# Patient Record
Sex: Female | Born: 1950
Health system: Southern US, Community
[De-identification: ages and names within clinical notes are randomized; demographics above are authoritative.]

## PROBLEM LIST (undated history)

## (undated) DIAGNOSIS — M1990 Unspecified osteoarthritis, unspecified site: Secondary | ICD-10-CM

## (undated) DIAGNOSIS — M2550 Pain in unspecified joint: Secondary | ICD-10-CM

## (undated) DIAGNOSIS — R42 Dizziness and giddiness: Secondary | ICD-10-CM

## (undated) DIAGNOSIS — M25569 Pain in unspecified knee: Secondary | ICD-10-CM

## (undated) DIAGNOSIS — R200 Anesthesia of skin: Secondary | ICD-10-CM

## (undated) DIAGNOSIS — A6920 Lyme disease, unspecified: Secondary | ICD-10-CM

## (undated) DIAGNOSIS — F32A Depression, unspecified: Secondary | ICD-10-CM

## (undated) DIAGNOSIS — F419 Anxiety disorder, unspecified: Secondary | ICD-10-CM

## (undated) DIAGNOSIS — M549 Dorsalgia, unspecified: Secondary | ICD-10-CM

## (undated) DIAGNOSIS — I10 Essential (primary) hypertension: Secondary | ICD-10-CM

## (undated) DIAGNOSIS — F329 Major depressive disorder, single episode, unspecified: Secondary | ICD-10-CM

## (undated) HISTORY — DX: Essential (primary) hypertension: I10

## (undated) HISTORY — DX: Unspecified osteoarthritis, unspecified site: M19.90

## (undated) HISTORY — DX: Major depressive disorder, single episode, unspecified: F32.9

## (undated) HISTORY — PX: BACK SURGERY: SHX140

## (undated) HISTORY — DX: Pain in unspecified knee: M25.569

## (undated) HISTORY — PX: TONSILLECTOMY AND ADENOIDECTOMY: SUR1326

## (undated) HISTORY — PX: WISDOM TOOTH EXTRACTION: SHX21

## (undated) HISTORY — DX: Depression, unspecified: F32.A

## (undated) HISTORY — DX: Anesthesia of skin: R20.0

## (undated) HISTORY — DX: Anxiety disorder, unspecified: F41.9

## (undated) HISTORY — PX: LASIK: SHX215

## (undated) HISTORY — DX: Pain in unspecified joint: M25.50

## (undated) HISTORY — DX: Dorsalgia, unspecified: M54.9

## (undated) HISTORY — DX: Lyme disease, unspecified: A69.20

## (undated) HISTORY — DX: Dizziness and giddiness: R42

---

## 2002-02-12 HISTORY — PX: DILATION AND CURETTAGE OF UTERUS: SHX78

## 2003-09-18 ENCOUNTER — Emergency Department (HOSPITAL_COMMUNITY): Admission: EM | Admit: 2003-09-18 | Discharge: 2003-09-18 | Payer: Self-pay | Admitting: Family Medicine

## 2003-09-18 ENCOUNTER — Inpatient Hospital Stay (HOSPITAL_COMMUNITY): Admission: EM | Admit: 2003-09-18 | Discharge: 2003-09-19 | Payer: Self-pay | Admitting: Emergency Medicine

## 2003-10-12 ENCOUNTER — Encounter: Admission: RE | Admit: 2003-10-12 | Discharge: 2003-10-12 | Payer: Self-pay | Admitting: Obstetrics and Gynecology

## 2003-10-25 ENCOUNTER — Ambulatory Visit (HOSPITAL_COMMUNITY): Admission: RE | Admit: 2003-10-25 | Discharge: 2003-10-25 | Payer: Self-pay | Admitting: Obstetrics and Gynecology

## 2003-11-30 ENCOUNTER — Ambulatory Visit: Payer: Self-pay | Admitting: Obstetrics & Gynecology

## 2003-11-30 ENCOUNTER — Other Ambulatory Visit: Admission: RE | Admit: 2003-11-30 | Discharge: 2003-11-30 | Payer: Self-pay | Admitting: Obstetrics & Gynecology

## 2003-12-14 ENCOUNTER — Ambulatory Visit: Payer: Self-pay | Admitting: Obstetrics & Gynecology

## 2005-10-04 ENCOUNTER — Ambulatory Visit (HOSPITAL_COMMUNITY): Admission: RE | Admit: 2005-10-04 | Discharge: 2005-10-04 | Payer: Self-pay | Admitting: Obstetrics and Gynecology

## 2005-10-04 ENCOUNTER — Ambulatory Visit: Payer: Self-pay | Admitting: Obstetrics and Gynecology

## 2005-11-29 ENCOUNTER — Ambulatory Visit: Payer: Self-pay | Admitting: Obstetrics and Gynecology

## 2006-02-18 ENCOUNTER — Ambulatory Visit (HOSPITAL_COMMUNITY): Admission: RE | Admit: 2006-02-18 | Discharge: 2006-02-18 | Payer: Self-pay | Admitting: Obstetrics and Gynecology

## 2006-03-14 ENCOUNTER — Ambulatory Visit: Payer: Self-pay | Admitting: Obstetrics & Gynecology

## 2006-03-29 ENCOUNTER — Encounter (INDEPENDENT_AMBULATORY_CARE_PROVIDER_SITE_OTHER): Payer: Self-pay | Admitting: Specialist

## 2006-03-29 ENCOUNTER — Ambulatory Visit: Payer: Self-pay | Admitting: Obstetrics & Gynecology

## 2006-03-29 ENCOUNTER — Ambulatory Visit (HOSPITAL_COMMUNITY): Admission: RE | Admit: 2006-03-29 | Discharge: 2006-03-29 | Payer: Self-pay | Admitting: Obstetrics & Gynecology

## 2006-05-16 ENCOUNTER — Ambulatory Visit: Payer: Self-pay | Admitting: Gynecology

## 2010-03-05 ENCOUNTER — Encounter: Payer: Self-pay | Admitting: *Deleted

## 2010-06-30 NOTE — Group Therapy Note (Signed)
NAME:  Chloe Odom, Chloe Odom NO.:  [id number]   MEDICAL RECORD NO.:  [id number]          PATIENT TYPE:  WOC   LOCATION:  [facility]                   FACILITY:  [facility]   PHYSICIAN:  Argentina Donovan, MD        DATE OF BIRTH:  [birth year]   DATE OF SERVICE:  11/29/2005                                    CLINIC NOTE   The patient is a 60 year old female who came in previously because of some  postmenopausal bleeding.  She was seen in November by Dr. Penne Lash.  She had  an ultrasound that showed thickened endometrium.  Endometrial biopsy was  done, and she was treated for atypia with progesterone.  She never followed  up at that time because of pain and her fear of endometrial biopsy.  She had  had no bleeding at that time in 2005, until the recent bleeding episode.  We  repeated an ultrasound, and it showed persistent endometrial thickening.  The patient has had no bleeding since her last visit, and desires to wait  and get another ultrasound.  We talked about a 3 months time as a maximum  that we could want to wait.  She agrees to that, and if it is thickened,  then to sample the endometrium we will do it with an in-house D&C.  The  other thing is that if she has any bleeding between now and that time, she  is to call or come in and we would schedule a D&C at that time.  She is also  having a problem with hot flashes, sleep difficulty and menopause, and we  told her we could not use estrogen with the thickened endometrium, but we  could try her on Prozac.  She is willing to do that.  I am going to put her  on Prozac 20 mg q.a.m. and see how she does on that.           ______________________________  Argentina Donovan, MD     PR/MEDQ  D:  11/29/2005  T:  12/02/2005  Job:  [id number]

## 2010-06-30 NOTE — Discharge Summary (Signed)
NAME:  Chloe Odom, STINGER                          ACCOUNT NO.:  [id number]   MEDICAL RECORD NO.:  [id number]                   PATIENT TYPE:  INP   LOCATION:  2033                                 FACILITY:  [facility]   PHYSICIAN:  Eduardo Osier. Sharyn Lull, M.D.              DATE OF BIRTH:  [birth year]   DATE OF ADMISSION:  09/18/2003  DATE OF DISCHARGE:  09/19/2003                                 DISCHARGE SUMMARY   ADMISSION DIAGNOSES:  1.  Chest pain, rule out myocardial infarction.  2.  Morbid obesity.  3.  Positive family history of coronary artery disease.   DISCHARGE DIAGNOSES:  1.  Status post chest pain with negative stress Cardiolite, rule out      gastroesophageal reflux disease, rule out peptic ulcer disease.  2.  Morbid obesity.  3.  Positive family history of coronary artery disease.  4.  Hypercholesterolemia controlled by diet.   DISCHARGE MEDICATIONS:  1.  Baby aspirin 81 mg p.o. q.d.  2.  Protonix 40 mg p.o. q.d.  3.  Nitrostat sublingual 0.4 mg.   DIET:  Low salt, low cholesterol diet.   ACTIVITY:  As tolerated.   FOLLOW UP:  Follow up with me in 2 weeks.   CONDITION ON DISCHARGE:  Stable.   HISTORY OF PRESENT ILLNESS:  [name] is a 60 year old, white female with  no significant past medical history except for family history of coronary  artery disease and postmenopausal and morbid obesity who complains of  retrosternal chest pain described as pressure tightness grade 7/10 lasting a  few minutes for the last 2-3 weeks.  Today, pain was severe associated with  dizziness radiating to the left side of the neck and she decided to come to  the ER.  The patient denies any palpitations, lightheadedness or syncope.  She denies any PND, orthopnea, leg swelling.  Denies exertional chest pain,  earache, fevers, chills or cough.  She denies any cardiac workup in the  past.   PAST MEDICAL HISTORY:  As above.   PAST SURGICAL HISTORY:  1.  Tonsillectomy.  2.  Tubal ligation  many years ago.   ALLERGIES:  No known drug allergies.   MEDICATIONS:  Advil on p.r.n. basis.   SOCIAL HISTORY:  She is single, has three children with no history of  smoking or alcohol abuse.  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   FAMILY HISTORY:  Father died of massive MI at the age of 61.  Mother died  after stroke at the age of 39.  She was hypertensive, diabetic.  She also  had chronic atrial fibrillation and congestive heart failure.  One sister  has mitral valve prolapse and atrial fibrillation.  One brother has coronary  artery disease and has enlarged heart.  One brother is hypertensive.   PHYSICAL EXAMINATION:  GENERAL:  She is alert and oriented x3 in no acute  distress.  VITAL SIGNS:  Blood pressure 130/76, pulse 72 and regular.  HEENT:  Conjunctivae pink.  NECK:  Supple, no JVD or bruit.  LUNGS:  Clear to auscultation bilaterally without rhonchi or rales.  CARDIAC:  S1, S2 was normal.  There was no S3 gallop or murmur.  ABDOMEN:  Soft, bowel sounds present, nontender.  There was no organomegaly.  EXTREMITIES:  No clubbing, cyanosis or edema.   LABORATORY DATA AND X-RAY FINDINGS:  EKG showed normal sinus rhythm with no  acute ischemic changes.  Her two sets of cardiac enzymes with CKs were  negative with CK 28, MB 1.0; second set with CK 26, MB 0.8.  Two sets of  troponin I were negative at 0.02 and 0.01.  Her cholesterol was 179, HDL 50,  LDL slightly elevated at 108.  Three sets of cardiac enzymes in ER with  point of care enzymes were all negative.  Sodium 140, potassium 4.1,  chloride 106, glucose 87, bicarb 25, BUN 8, creatinine 0.8.  Her liver  enzymes were slightly elevated with AST 46, ALT 103, Alk phos 140, total  bilirubin 0.5.  Homocystine level was 9.87.  Hemoglobin 14.4, hematocrit  41.8, white count 7.2 with no shift to the left.   Stress Cardiolite was negative for myocardial ischemia or infarction with  normal wall motion, questionable LVH with EF  of 83%.   HOSPITAL COURSE:  The patient was admitted to the telemetry unit and MI was  ruled by serial enzymes and EKG.  The patient underwent stress Cardiolite on  August 7, exercising for 54 minutes on Bruce protocol achieving heart rate  of 148 and peak blood pressure of 164/80.  EKG showed no ST and T wave  changes and Cardiolite scan was negative for reversible ischemia or  infarction with EF of 33%.  The patient did not have any further episodes of  chest pain during the hospital stay.  The patient will be discharged home on  the above medications and will be followed up in my office in 2 weeks.  He  will repeat the liver hepatic function panel as outpatient and if it is  still elevated, will do further workup.                                                Eduardo Osier. Sharyn Lull, M.D.    MNH/MEDQ  D:  10/21/2003  T:  10/21/2003  Job:  [id number]

## 2010-06-30 NOTE — Group Therapy Note (Signed)
NAME:  Chloe Odom, Chloe Odom NO.:  [id number]   MEDICAL RECORD NO.:  [id number]          PATIENT TYPE:  WOC   LOCATION:  [facility]                   FACILITY:  [facility]   PHYSICIAN:  Argentina Donovan, MD        DATE OF BIRTH:  1950/07/12   DATE OF SERVICE:  10/04/2005                                    CLINIC NOTE   GYN CLINIC VISIT   HISTORY:  The patient is a 60 year old white female who came in because of  postmenopausal bleeding.  She had a Pap smear 1 week ago at the health  department, results of which are not available as yet.  She was last seen in  November 2005 by Dr. Penne Lash, at which time because of postmenopausal  bleeding or perimenopausal bleeding, she had an ultrasound that showed a  thickened endometrium.  Endometrial biopsy was done; and the patient had  some atypia; and was treated with progesterone.  She never returned for  followup because of the pain she had, and the fear from the endometrial  biopsy.  There has been no bleeding since that time in 2005, until the  recent bleeding episode.  We are going to repeat the ultrasound with a  measuring of the endometrial stripe.  If it is abnormal, we will call her  and schedule her for a D&C in the hospital.  If it is not thickened, we may  just observe her for another couple months.   IMPRESSION:  1. Postmenopausal bleeding.  2. Rule out endometrial hyperplasia.           ______________________________  Argentina Donovan, MD     PR/MEDQ  D:  10/04/2005  T:  10/05/2005  Job:  [id number]

## 2010-06-30 NOTE — Group Therapy Note (Signed)
NAME:  Chloe Odom, MICELI NO.:  [id number]   MEDICAL RECORD NO.:  [id number]          PATIENT TYPE:  WOC   LOCATION:  [facility]                   FACILITY:  [facility]   PHYSICIAN:  Ginger Carne, MD DATE OF BIRTH:  Jul 11, 1950   DATE OF SERVICE:  05/16/2006                                  CLINIC NOTE   This patient returned today for results from her hysteroscopy, dilation  and curettage.  Pathology report indicated no evidence of neoplasia or  hyperplasia, with disordered proliferative endometrium and simple  hyperplasia.  The patient had several questions which were addressed.  She asked why she had hyperplasia and I explained to her that the  patient with elevated BMIs do generate more endogenous estrone and  estradiol as comparable women of same age who are thinner.  She wondered  about continuation of Prozac.  Dr. Okey Dupre had placed her on this for  anxiety and issues of depression; I suggested that she continue the  same.  She was concerned about one of her family members reading  something about increased risk of Alzheimer's disease; I explained to  her I was unaware of this correlation.  The discussion then centered  around the use of hormone-replacement therapy.  I explained to her the  pros and cons and information was provided to her.  I specifically  explained that the current recommendations are that a family history is  not a contraindication to hormone replacement therapy unless there was a  genetic predisposition based BRAC1 or 2 findings.  She does not fall in  this category.  We discussed also the benefits as far sleeping, hot  flashes and so on.  She seems to think that her hot flashes,  irritability and night sweats have occurred since her D&C.  I explained  to her this is more coincidence than it would be a causative factor.   The discussion was left that the patient would review the literature  provided to her, discuss this information with her  family members and  get back to one of the providers if she decides to go on hormone  replacement therapy.  Her TSH, free T4 and free T3 values were normal  from October 2007.  It should be added that the patient was somewhat  upset because Dr. Marice Potter was not present.  She was explained that Dr. Marice Potter  had other commitments also.  She was upset that she had to wait about 20-  30 minutes before being seen and then a longer period of time to have  her pathology report available.  The patient was calmed down with the  presence of a nurse.           ______________________________  Ginger Carne, MD     SHB/MEDQ  D:  05/17/2006  T:  05/17/2006  Job:  [id number]

## 2010-06-30 NOTE — Group Therapy Note (Signed)
NAME:  Chloe Odom, Chloe Odom NO.:  [id number]   MEDICAL RECORD NO.:  [id number]          PATIENT TYPE:  WOC   LOCATION:  [facility]                   FACILITY:  [facility]   PHYSICIAN:  Elsie Lincoln, MD      DATE OF BIRTH:  [birth year]   DATE OF SERVICE:  12/14/2003                                    CLINIC NOTE   REASON FOR VISIT:  The patient is a 60 year old female perimenopausal who is  having some irregular bleeding.  She was found to have a 1.1 cm lining on  ultrasound.  Endometrial biopsy done was 2 weeks ago which showed disorder  of proliferative endometrium with focal simple hyperplasia without atypia.  There is no evidence of atypical features or carcinoma.  We had a lengthy  discussion together about what hyperplasia meant and the fact that there was  no atypia was reassuring.  The patient's plan is to be managed medically.  The patient does not desire a hysterectomy or a D&C at this time.  The  patient is explained that taking Provera 10 mg p.o. daily for 14 days each  month and having withdrawal bleed will resolve hyperplasia in 75 to 90% of  cases.  The patient understands that re-sampling is necessary in 3 months.  The patient states that she does not want to have sampling done in the  office secondary to cramping.  She would like to be put to sleep.  We  explained to the patient that this would be extensive and insurance might  not pay.  She stated that she did not have insurance and would be willing to  pay the bill.  The patient was also explained that a D&C would not prevent  cramping after the procedure so she would still have those post D&C pains.  We decided that we would talk further about sampling methods when she  returns in 3 months.  During her last exam, one of the [facility] nurses was doing  help with the speculum exam and she stated that at this point she does not  wish to have any more trainees during her examinations.  We explained that  this was not  a problem and we would respect her wishes and it would not  change her health care.   ASSESSMENT:  A 60 year old female with endometrial hyperplasia.   PLAN:  1.  Will treat with Provera 10 mg p.o. daily for 2 weeks each month for 3      months.  2.  Return to clinic in 3 months for sampling.      KL/MEDQ  D:  12/14/2003  T:  12/14/2003  Job:  [id number]

## 2010-06-30 NOTE — Op Note (Signed)
NAME:  Chloe Odom, Chloe Odom                ACCOUNT NO.:  [id number]   MEDICAL RECORD NO.:  [id number]          PATIENT TYPE:  AMB   LOCATION:  [facility]                           [facility]   PHYSICIAN:  Allie Bossier, MD        DATE OF BIRTH:  1950-07-07   DATE OF PROCEDURE:  03/29/2006  DATE OF DISCHARGE:                               OPERATIVE REPORT   PREOPERATIVE DIAGNOSIS:  Uterine hyperplasia with atypia.   POSTOPERATIVE DIAGNOSIS:  Uterine hyperplasia with atypia.   PROCEDURE:  D&C.   SURGEON:  Allie Bossier, MD   ANESTHESIA:  Andres Labrum, MD   COMPLICATIONS:  None.   ESTIMATED BLOOD LOSS:  Minimal.   SPECIMENS:  Uterine curettings.   DETAILED PROCEDURE AND FINDINGS:  The risks, benefits, and alternatives  of surgery were explained and accepted.  Consents were signed in the  operating room.  General anesthesia was accomplished without  complications.  She was placed in a dorsal lithotomy position.  Her  vagina was prepped and draped in usual sterile fashion.  Her bladder was  emptied with a Robinson catheter.  A bimanual exam revealed normal size  and shape, anteverted, mobile uterus and nonenlarged adnexa.  A speculum  was placed.  A single-tooth tenaculum was placed.  The uterus was  sounded to 8 cm.  The cervix was easily dilated with Shawnie Pons dilators to  accommodate a sharp curette.  Sharp curettage was done in all quadrants  and fundus of the uterus.  Minimal tissue was obtained.  A gritty  sensation was appreciated throughout.  The tenaculum was removed.  No  bleeding was noted from the sites.  Then 10 mL of 1% lidocaine was used  as a paracervical block for postoperative pain management.  She  tolerated the procedure well.  The instrument, sponge, and needle counts  were correct.      Allie Bossier, MD  Electronically Signed     MCD/MEDQ  D:  03/29/2006  T:  03/29/2006  Job:  [id number]

## 2010-06-30 NOTE — Group Therapy Note (Signed)
NAME:  Chloe Odom, Chloe Odom                          ACCOUNT NO.:  [id number]   MEDICAL RECORD NO.:  [id number]                   PATIENT TYPE:  OUT   LOCATION:  [facility]                           FACILITY:  [facility]   PHYSICIAN:  Argentina Donovan, MD                     DATE OF BIRTH:  1950/06/16   DATE OF SERVICE:  10/12/2003                                    CLINIC NOTE   REASON FOR VISIT:  The patient is a 60 year old white female who was sent by  the health department because of irregular periods.  The patient keeps a  very good record, starting having some irregular cycles approximately 1 year  ago.  She would miss a month and then have a normal period.  Around the time  in May that she had her Pap smear, she had no period and then bled for 3  weeks after that, got concerned, and they referred her to [place].  We have  discussed perimenopausal bleeding with the patient in detail.  We are going  to send her for an ultrasound.  If there is a thickened endometrium we have  told her we would do an endometrial biopsy.  If not, we want her to continue  to keep good records of her cycle and if it is still very irregular, she is  a nonsmoker and we could cycle her on the oral contraceptives or give her  Provera each month to bring on a period if she goes more than 5 or 6 weeks  without a period.   IMPRESSION:  Perimenopausal bleeding.                                               Argentina Donovan, MD    PR/MEDQ  D:  10/12/2003  T:  10/13/2003  Job:  [id number]

## 2011-07-09 ENCOUNTER — Encounter (HOSPITAL_COMMUNITY): Payer: Self-pay

## 2011-07-09 ENCOUNTER — Emergency Department (HOSPITAL_COMMUNITY)
Admission: EM | Admit: 2011-07-09 | Discharge: 2011-07-09 | Disposition: A | Discharge status: Home / Self Care | Payer: Self-pay | Source: Home / Self Care | Attending: Family Medicine | Admitting: Family Medicine

## 2011-07-09 DIAGNOSIS — H8110 Benign paroxysmal vertigo, unspecified ear: Secondary | ICD-10-CM

## 2011-07-09 DIAGNOSIS — R42 Dizziness and giddiness: Secondary | ICD-10-CM

## 2011-07-09 MED ORDER — DIAZEPAM 5 MG PO TABS: 5.0000 mg | ORAL_TABLET | Freq: Two times a day (BID) | ORAL | Status: AC

## 2011-07-09 MED ORDER — MECLIZINE HCL 50 MG PO TABS
50.0000 mg | ORAL_TABLET | Freq: Three times a day (TID) | ORAL | Status: AC | PRN
Start: 2011-07-09 — End: 2011-07-19

## 2011-07-09 NOTE — ED Notes (Signed)
C/o episodic dizziness for past 2 weeks, worse past couple of days; worse w position changes; NAD

## 2011-07-09 NOTE — ED Provider Notes (Signed)
History     CSN: [id number]  Arrival date & time 07/09/11  1242   First MD Initiated Contact with Patient 07/09/11 1311      Chief Complaint  Patient presents with  . Dizziness    (Consider location/radiation/quality/duration/timing/severity/associated sxs/prior treatment) Patient is a 61 y.o. female presenting with neurologic complaint.  Neurologic Problem The primary symptoms include dizziness. Primary symptoms do not include headaches, syncope, loss of consciousness, seizures, visual change, focal weakness, loss of sensation, speech change, memory loss, nausea or vomiting. The symptoms began more than 1 week ago. The symptoms are worsening. Context: assoc with positional changes, no pain ,no ha, no n/v.  Dizziness does not occur with nausea, vomiting or weakness.  Additional symptoms do not include weakness.    History reviewed. No pertinent past medical history.  History reviewed. No pertinent past surgical history.  History reviewed. No pertinent family history.  History  Substance Use Topics  . Smoking status: Not on file  . Smokeless tobacco: Not on file  . Alcohol Use: Not on file    OB History    Grav Para Term Preterm Abortions TAB SAB Ect Mult Living                  Review of Systems  Constitutional: Negative.   HENT: Negative.   Cardiovascular: Negative for syncope.  Gastrointestinal: Negative for nausea, vomiting and diarrhea.  Musculoskeletal: Negative.   Skin: Negative.   Neurological: Positive for dizziness. Negative for speech change, focal weakness, seizures, loss of consciousness, syncope, speech difficulty, weakness, light-headedness, numbness and headaches.  Psychiatric/Behavioral: Negative for memory loss.    Allergies  Review of patient's allergies indicates not on file.  Home Medications  No current outpatient prescriptions on file.  BP 149/78  Pulse 87  Temp(Src) 97.8 F (36.6 C) (Oral)  Resp 16  SpO2 100%  Physical Exam    Nursing note and vitals reviewed. Constitutional: She is oriented to person, place, and time. She appears well-developed and well-nourished.  HENT:  Right Ear: External ear normal.  Left Ear: External ear normal.  Eyes: Conjunctivae and EOM are normal. Pupils are equal, round, and reactive to light.  Neck: Normal range of motion and full passive range of motion without pain. Neck supple. Normal carotid pulses and no JVD present. Muscular tenderness present. Carotid bruit is not present. No Brudzinski's sign and no Kernig's sign noted.  Cardiovascular: Regular rhythm and normal heart sounds.   Abdominal: Soft. Bowel sounds are normal.  Lymphadenopathy:    She has no cervical adenopathy.  Neurological: She is alert and oriented to person, place, and time. No cranial nerve deficit. Coordination normal.  Skin: Skin is warm and dry.    ED Course  Procedures (including critical care time)  Labs Reviewed - No data to display No results found.   No diagnosis found.    MDM          Linna Hoff, MD 07/09/11 1444

## 2012-12-04 ENCOUNTER — Other Ambulatory Visit: Payer: Self-pay | Admitting: Ophthalmology

## 2013-02-12 HISTORY — PX: LEG SURGERY: SHX1003

## 2013-07-14 ENCOUNTER — Other Ambulatory Visit: Payer: Self-pay | Admitting: Physician Assistant

## 2014-02-03 ENCOUNTER — Encounter: Payer: Self-pay | Admitting: Neurology

## 2014-02-22 ENCOUNTER — Encounter: Payer: Self-pay | Admitting: Neurology

## 2014-02-23 ENCOUNTER — Encounter: Payer: Self-pay | Admitting: Neurology

## 2014-02-23 ENCOUNTER — Ambulatory Visit (INDEPENDENT_AMBULATORY_CARE_PROVIDER_SITE_OTHER): Payer: 59 | Admitting: Neurology

## 2014-02-23 VITALS — BP 120/84 | HR 89 | Temp 98.1°F | Ht 64.0 in | Wt 220.0 lb

## 2014-02-23 DIAGNOSIS — R400 Somnolence: Secondary | ICD-10-CM

## 2014-02-23 DIAGNOSIS — R351 Nocturia: Secondary | ICD-10-CM

## 2014-02-23 DIAGNOSIS — H9311 Tinnitus, right ear: Secondary | ICD-10-CM

## 2014-02-23 DIAGNOSIS — R42 Dizziness and giddiness: Secondary | ICD-10-CM

## 2014-02-23 DIAGNOSIS — G4710 Hypersomnia, unspecified: Secondary | ICD-10-CM

## 2014-02-23 DIAGNOSIS — R0683 Snoring: Secondary | ICD-10-CM

## 2014-02-23 NOTE — Patient Instructions (Addendum)
Please remember, that vertigo can recur without warning. It can last hours or days. Please change positions slowly and always stay well-hydrated. Physical therapy with particular attention to vestibular rehabilitation can be very helpful. While there is no specific medication that helps with vertigo, some people get relief with as needed use of meclizine. Certain medications can exacerbate vertigo, especially medications that work on your central nervous system.   We will request a brain MRI with and without contrast and call you back with the results.   We will request a home sleep test to evaluate your sleep.

## 2014-02-23 NOTE — Progress Notes (Signed)
Subjective:    Patient ID: Chloe Odom is a 64 y.o. female.  HPI     Star Age, MD, PhD [facility] [address], [address] [place], [place] [address]   Dear Dr. Maudie Mercury,   I saw your patient, Chloe Odom, upon your kind request in my neurologic clinic today for initial consultation of her dizziness. The patient is unaccompanied today. As you know, Chloe Odom is a 64 year old right-handed woman with an underlying medical history of hypertension, arthritis, ADD, depression and obesity, who has a prior diagnosis of benign positional vertigo for which she was seen in the emergency room on 07/09/2011, who reports intermittent spells of dizziness, in that she has had brief episodes of a sense of spinning or swaying, usually lasting for minutes at a time. She had some symptoms last month. She has not had any in the last week or so. She does not have much in the way of headaches. She denies one-sided symptoms such as numbness, tingling, facial droop or weakness or slurring of speech. She has never had a brain scan. She was recently changed from Celexa to Cymbalta for residual depression which she feels has helped her depression. She has a history of low back pain and hip pain. She has tried a muscle relaxer and has recently been started on oxycodone. She takes this 5 times a day. Her symptoms started before she was started on her muscle relaxer or her pain pill. She does report intermittent ringing in her ears, right more than left and perhaps some hearing loss according to what her children tell her. She has 3 grown children and 8 grandchildren. She is divorced for many years and lives alone. Of note, she snores and has woken herself up with snoring and an occasional sense of gasping for air. She does not necessarily sleep well at night. Sometimes she has nighttime anxiety. She has sleepiness during the day. She works 3 days a week. She has to go to the bathroom once or  twice per night. She denies morning headaches. When she is up on her feet she has some swelling of her feet at the end of the day. She has tried meclizine in the past but did not think it helped. She has never seen an ENT physician. She has never had physical therapy for vertigo. She has a family history of brain aneurysm in a cousin and a family history of epilepsy. She reports night sweats and wonders if she would benefit from estrogen replacement therapy. She does report a family history of breast cancer.  Her Past Medical History Is Significant For: Past Medical History  Diagnosis Date  . Hypertension   . Lyme disease   . Dizziness   . Depression   . Arthritis   . Back pain   . Knee pain   . Numbness   . Polyarthralgia   . Anxiety     Her Past Surgical History Is Significant For: Past Surgical History  Procedure Laterality Date  . Dilation and curettage of uterus  2004  . Tonsillectomy and adenoidectomy      age 31  . Leg surgery Right 2015    skin cancer off the lateral aspect     Her Family History Is Significant For: Family History  Problem Relation Age of Onset  . CAD Mother   . Diabetes Mother   . Fibromyalgia Mother   . Osteoarthritis Mother   . Stroke Mother   . Heart  attack Father     Her Social History Is Significant For: History   Social History  . Marital Status: Single    Spouse Name: N/A    Number of Children: 3  . Years of Education: N/A   Occupational History  .      hairdresser   Social History Main Topics  . Smoking status: Never Smoker   . Smokeless tobacco: Never Used  . Alcohol Use: No  . Drug Use: No  . Sexual Activity: None   Other Topics Concern  . None   Social History Narrative    Her Allergies Are:  Allergies  Allergen Reactions  . Penicillins   . Prednisone   :   Her Current Medications Are:  Outpatient Encounter Prescriptions as of 02/23/2014  Medication Sig  . amphetamine-dextroamphetamine (ADDERALL) 10 MG  tablet Take 10 mg by mouth daily with breakfast.  . Biotin (BIOTIN MAXIMUM STRENGTH) 10 MG TABS Take by mouth 2 (two) times daily. 10000 mcg , 2 tablets once daily  . Calcium Carbonate-Vitamin D (CALTRATE 600+D PO) Take by mouth. 600-400mg -unit, one tablet with food once daily  . carvedilol (COREG) 3.125 MG tablet Take 3.125 mg by mouth daily.   Marland Kitchen CITALOPRAM HYDROBROMIDE PO Take 40 mg by mouth daily.  . hydrochlorothiazide (HYDRODIURIL) 25 MG tablet Take 25 mg by mouth daily.  . Ibuprofen (ADVIL) 200 MG CAPS Take 200 mg by mouth as needed. Every 6 hours as needed for pain  . meclizine (ANTIVERT) 25 MG tablet Take 25 mg by mouth 4 (four) times daily. As needed  . methocarbamol (ROBAXIN) 500 MG tablet Take 500 mg by mouth 4 (four) times daily. As needed  . Multiple Vitamin (MULTIVITAMIN) capsule Take 1 capsule by mouth daily.  . OXYCODONE HCL PO Take 5 mg by mouth 5 (five) times daily.  :   Review of Systems:  Out of a complete 14 point review of systems, all are reviewed and negative with the exception of these symptoms as listed below:   Review of Systems  Constitutional: Positive for fatigue.  HENT:       Ringing in ears  Endocrine: Positive for cold intolerance and heat intolerance.       Flushing  Musculoskeletal: Positive for joint swelling.       Joint pain,aching muscles  Skin:       Itching, moles  Neurological: Positive for dizziness and headaches.       Sleepiness ,snoring,restless leg  Psychiatric/Behavioral:       Depression, anxiety,,decreased energy,racing thoughts    Objective:  Neurologic Exam  Physical Exam Physical Examination:   Filed Vitals:   02/23/14 1428  BP: 120/84  Pulse: 89  Temp:    Her sitting blood pressure and pulse was 110/72 with a pulse of 84, standing was 120/84 with a pulse of 89. She did not have any orthostatic lightheadedness.  General Examination: The patient is a very pleasant 64 y.o. female in no acute distress. She appears  well-developed and well-nourished and very well groomed.   HEENT: Normocephalic, atraumatic, pupils are equal, round and reactive to light and accommodation. Funduscopic exam is normal with sharp disc margins noted. Extraocular tracking is good without limitation to gaze excursion or nystagmus noted. Normal smooth pursuit is noted. Hearing is grossly intact. Tympanic membranes are clear bilaterally. Face is symmetric with normal facial animation and normal facial sensation. Speech is clear with no dysarthria noted. There is no hypophonia. There is no lip, neck/head, jaw or  voice tremor. Neck is supple with full range of passive and active motion. There are no carotid bruits on auscultation. Oropharynx exam reveals: Mild mouth dryness, good dental hygiene and moderate airway crowding, due to narrow airway entry and redundant soft palate. Tonsils are absent. Mallampati is class II. Neck circumference is 15-7/8 inches. She did not have any vertiginous symptoms upon changes of her head position.  Chest: Clear to auscultation without wheezing, rhonchi or crackles noted.  Heart: S1+S2+0, regular and normal without murmurs, rubs or gallops noted.   Abdomen: Soft, non-tender and non-distended with normal bowel sounds appreciated on auscultation.  Extremities: There is no pitting edema in the distal lower extremities bilaterally. Pedal pulses are intact.  Skin: Warm and dry without trophic changes noted. There are no varicose veins.  Musculoskeletal: exam reveals no obvious joint deformities, tenderness or joint swelling or erythema.   Neurologically:  Mental status: The patient is awake, alert and oriented in all 4 spheres. Her immediate and remote memory, attention, language skills and fund of knowledge are appropriate. There is no evidence of aphasia, agnosia, apraxia or anomia. Speech is clear with normal prosody and enunciation. Thought process is linear. Mood is normal and affect is normal.  Cranial  nerves II - XII are as described above under HEENT exam. In addition: shoulder shrug is normal with equal shoulder height noted. Motor exam: Normal bulk, strength and tone is noted. There is no drift, tremor or rebound. Romberg is negative, after initial brief sway. Reflexes are 2+ throughout. Babinski: Toes are flexor bilaterally. Fine motor skills and coordination: intact with normal finger taps, normal hand movements, normal rapid alternating patting, normal foot taps and normal foot agility.  Cerebellar testing: No dysmetria or intention tremor on finger to nose testing. Heel to shin is unremarkable bilaterally. There is no truncal or gait ataxia.  Sensory exam: intact to light touch, pinprick, vibration, temperature sense in the upper and lower extremities, with the exception of patchy decrease in pinprick sensation in her lower extremities below the knee bilaterally.   Gait, station and balance: She stands easily. No veering to one side is noted. No leaning to one side is noted. Posture is age-appropriate and stance is narrow based. Gait shows normal stride length and normal pace. No problems turning are noted. She turns en bloc. Tandem walk is unremarkable, after initial adjustment.                Assessment and Plan:   In summary, ALENE BERGERSON is a very pleasant 64 y.o.-year old female with an underlying medical history of hypertension, arthritis, ADD, depression and obesity, who has a prior diagnosis of benign positional vertigo for which she was seen in the emergency room on 07/09/2011, who reports intermittent spells of dizziness, in that she has had brief episodes of a sense of spinning or swaying, usually lasting for minutes at a time. Her history is in keeping with positional vertigo. Her exam is benign. She has no orthostatic drop of her blood pressure and no orthostatic symptoms. She reports right-sided tinnitus and perhaps mild hearing loss. We will consider referral to an ENT specialist.  I talked to the patient at length about vertigo. I explained to her that vertigo can recur without warning. It can last hours or days. I advised her to change positions slowly and always stay well-hydrated. Physical therapy with particular attention to vestibular rehabilitation can be very helpful and if needed we will consider this down the road. While there  is no specific medication that helps with vertigo, some people get relief with as needed use of meclizine. She tried it in the past and did not find it helpful. I also explained to her that certain medications can exacerbate vertigo, especially medications that work on the central nervous system.  At this juncture, I suggested we proceed with a brain MRI with and without contrast and we will call her with the results. In addition, I would like to proceed with sleep evaluation because she reports snoring, daytime somnolence, nocturia and having woken up with a sense of gasping for air. She would prefer home sleep test which I will request. I will see her back routinely after these tests are completed and we will keep her posted over the phone. I did not suggest any new medications at this time. For her night sweats and questions about estrogen replacement treatment, she is advised to consider seeing a GYN.   Thank you very much for allowing me to participate in the care of this nice patient. If I can be of any further assistance to you please do not hesitate to call me at [phone].  Sincerely,   Star Age, MD, PhD

## 2014-05-25 ENCOUNTER — Ambulatory Visit: Payer: 59 | Admitting: Neurology

## 2014-10-29 ENCOUNTER — Observation Stay (HOSPITAL_COMMUNITY)
Admission: EM | Admit: 2014-10-29 | Discharge: 2014-10-31 | Disposition: A | Discharge status: Home / Self Care | Payer: 59 | Attending: Internal Medicine | Admitting: Internal Medicine

## 2014-10-29 ENCOUNTER — Encounter (HOSPITAL_COMMUNITY): Payer: Self-pay | Admitting: *Deleted

## 2014-10-29 ENCOUNTER — Emergency Department (HOSPITAL_COMMUNITY): Payer: 59

## 2014-10-29 DIAGNOSIS — R079 Chest pain, unspecified: Principal | ICD-10-CM | POA: Diagnosis present

## 2014-10-29 DIAGNOSIS — I10 Essential (primary) hypertension: Secondary | ICD-10-CM | POA: Diagnosis not present

## 2014-10-29 DIAGNOSIS — R0602 Shortness of breath: Secondary | ICD-10-CM | POA: Insufficient documentation

## 2014-10-29 DIAGNOSIS — R197 Diarrhea, unspecified: Secondary | ICD-10-CM | POA: Diagnosis not present

## 2014-10-29 DIAGNOSIS — F329 Major depressive disorder, single episode, unspecified: Secondary | ICD-10-CM | POA: Insufficient documentation

## 2014-10-29 DIAGNOSIS — R109 Unspecified abdominal pain: Secondary | ICD-10-CM | POA: Diagnosis not present

## 2014-10-29 DIAGNOSIS — R200 Anesthesia of skin: Secondary | ICD-10-CM

## 2014-10-29 DIAGNOSIS — Z79899 Other long term (current) drug therapy: Secondary | ICD-10-CM | POA: Insufficient documentation

## 2014-10-29 DIAGNOSIS — E876 Hypokalemia: Secondary | ICD-10-CM | POA: Diagnosis not present

## 2014-10-29 DIAGNOSIS — R202 Paresthesia of skin: Secondary | ICD-10-CM

## 2014-10-29 DIAGNOSIS — F419 Anxiety disorder, unspecified: Secondary | ICD-10-CM | POA: Diagnosis not present

## 2014-10-29 DIAGNOSIS — M1990 Unspecified osteoarthritis, unspecified site: Secondary | ICD-10-CM | POA: Insufficient documentation

## 2014-10-29 DIAGNOSIS — Z880 Allergy status to penicillin: Secondary | ICD-10-CM | POA: Diagnosis not present

## 2014-10-29 DIAGNOSIS — R634 Abnormal weight loss: Secondary | ICD-10-CM | POA: Diagnosis not present

## 2014-10-29 DIAGNOSIS — R112 Nausea with vomiting, unspecified: Secondary | ICD-10-CM

## 2014-10-29 DIAGNOSIS — Z8619 Personal history of other infectious and parasitic diseases: Secondary | ICD-10-CM | POA: Diagnosis not present

## 2014-10-29 LAB — CBC WITH DIFFERENTIAL/PLATELET
Basophils Absolute: 0 10*3/uL (ref 0.0–0.1)
Basophils Relative: 0 %
Eosinophils Absolute: 0 10*3/uL (ref 0.0–0.7)
Eosinophils Relative: 0 %
HCT: 43.5 % (ref 36.0–46.0)
HEMOGLOBIN: 15.7 g/dL — AB (ref 12.0–15.0)
LYMPHS ABS: 1.9 10*3/uL (ref 0.7–4.0)
LYMPHS PCT: 20 %
MCH: 30.2 pg (ref 26.0–34.0)
MCHC: 36.1 g/dL — ABNORMAL HIGH (ref 30.0–36.0)
MCV: 83.7 fL (ref 78.0–100.0)
Monocytes Absolute: 0.6 10*3/uL (ref 0.1–1.0)
Monocytes Relative: 6 %
NEUTROS PCT: 74 %
Neutro Abs: 7.1 10*3/uL (ref 1.7–7.7)
Platelets: 224 10*3/uL (ref 150–400)
RBC: 5.2 MIL/uL — AB (ref 3.87–5.11)
RDW: 12.8 % (ref 11.5–15.5)
WBC: 9.7 10*3/uL (ref 4.0–10.5)

## 2014-10-29 LAB — URINALYSIS, ROUTINE W REFLEX MICROSCOPIC
Bilirubin Urine: NEGATIVE
Glucose, UA: NEGATIVE mg/dL
HGB URINE DIPSTICK: NEGATIVE
Ketones, ur: NEGATIVE mg/dL
Leukocytes, UA: NEGATIVE
NITRITE: NEGATIVE
Protein, ur: NEGATIVE mg/dL
SPECIFIC GRAVITY, URINE: 1.006 (ref 1.005–1.030)
UROBILINOGEN UA: 0.2 mg/dL (ref 0.0–1.0)
pH: 7.5 (ref 5.0–8.0)

## 2014-10-29 LAB — COMPREHENSIVE METABOLIC PANEL
ALBUMIN: 3.9 g/dL (ref 3.5–5.0)
ALT: 39 U/L (ref 14–54)
AST: 41 U/L (ref 15–41)
Alkaline Phosphatase: 97 U/L (ref 38–126)
Anion gap: 14 (ref 5–15)
BUN: 6 mg/dL (ref 6–20)
CHLORIDE: 99 mmol/L — AB (ref 101–111)
CO2: 22 mmol/L (ref 22–32)
CREATININE: 0.93 mg/dL (ref 0.44–1.00)
Calcium: 9.6 mg/dL (ref 8.9–10.3)
GFR calc Af Amer: >60 mL/min (ref >60)
GFR calc non Af Amer: >60 mL/min (ref >60)
GLUCOSE: 112 mg/dL — AB (ref 65–99)
Potassium: 2.6 mmol/L — CL (ref 3.5–5.1)
SODIUM: 135 mmol/L (ref 135–145)
Total Bilirubin: 1.2 mg/dL (ref 0.3–1.2)
Total Protein: 7 g/dL (ref 6.5–8.1)

## 2014-10-29 LAB — TROPONIN I: Troponin I: <0.03 ng/mL (ref <0.031)

## 2014-10-29 LAB — LIPASE, BLOOD: LIPASE: 28 U/L (ref 22–51)

## 2014-10-29 MED ORDER — POTASSIUM CHLORIDE CRYS ER 20 MEQ PO TBCR
40.0000 meq | EXTENDED_RELEASE_TABLET | Freq: Once | ORAL | Status: AC
  Administered 2014-10-29: 40 meq via ORAL
  Filled 2014-10-29: qty 2

## 2014-10-29 MED ORDER — ONDANSETRON HCL 4 MG/2ML IJ SOLN
4.0000 mg | Freq: Once | INTRAMUSCULAR | Status: AC
  Administered 2014-10-29: 4 mg via INTRAVENOUS
  Filled 2014-10-29: qty 2

## 2014-10-29 MED ORDER — SODIUM CHLORIDE 0.9 % IV BOLUS (SEPSIS)
500.0000 mL | Freq: Once | INTRAVENOUS | Status: AC
  Administered 2014-10-29: 500 mL via INTRAVENOUS

## 2014-10-29 MED ORDER — POTASSIUM CHLORIDE 10 MEQ/100ML IV SOLN
10.0000 meq | Freq: Once | INTRAVENOUS | Status: AC
  Administered 2014-10-29: 10 meq via INTRAVENOUS

## 2014-10-29 NOTE — H&P (Signed)
[facility] History and Physical  Tachina Spoonemore Vandyken HWE:[id number] DOB: [birth year] DOA: 10/29/2014  PCP: Jani Gravel, MD   Chief Complaint: "I just don't feel well."  HPI: Chloe Odom is a 64 y.o. woman with a history of HTN, osteoarthritis, and lumbar stenosis who has not been well since the death of her grandson in early July.  She has had recurring episodes of substernal chest tightness.  The episodes have happened with exertion and at rest.  The pain dose not radiate and typically lasts 3-5 minutes at a time.  No associated diaphoresis but she sometimes feels short of breath with these episodes.  She has had nausea that she does not relate to her chest pain.  She also reports decreased appetite and intermittent nonbloody emesis.  She has had diarrhea for the past two weeks.  She has had some dizziness but no syncope.  She has lost 20 lbs in the past month.  She received a prescription for cipro earlier this week, for "bladder infection", but she only took a few doses because the antibiotic worsened her nausea.  She ultimately decided to come to the ED tonight because she has also had two days of worsening parasthesias in both hands, in addition to her other symptoms, as outlined above.  No fever.  No sick contacts.  She admits to high stress levels.  Her daughter was recently diagnosed with CAD, requiring stents.  Review of Systems: 12 systems reviewed and negative except as stated in HPI.  Past Medical History  Diagnosis Date  . Hypertension   . Lyme disease   . Dizziness   . Depression   . Arthritis   . Back pain   . Knee pain   . Numbness   . Polyarthralgia   . Anxiety    Past Surgical History  Procedure Laterality Date  . Dilation and curettage of uterus  2004  . Tonsillectomy and adenoidectomy      age 68  . Leg surgery Right 2015    skin cancer off the lateral aspect    Social History:  Social History   Social History Narrative  No tobacco, EtOH, or illicit drug  use.  She is not married.  She has three children.  She is a hair stylist.  Allergies  Allergen Reactions  . Penicillins     Childhood allergy  . Prednisone     jitters    Family History  Problem Relation Age of Onset  . CAD Mother   . Diabetes Mother   . Fibromyalgia Mother   . Osteoarthritis Mother   . Stroke Mother   . Heart attack Father   Daughter with recent MI, diagnosis of CAD  Prior to Admission medications   Medication Sig Start Date End Date Taking? Authorizing Provider  ALPRAZolam Duanne Moron) 1 MG tablet Take 1 mg by mouth 3 (three) times daily as needed for anxiety.  10/26/14  Yes Historical Provider, MD  carvedilol (COREG) 3.125 MG tablet Take 3.125 mg by mouth 2 (two) times daily with a meal.    Yes Historical Provider, MD  ciprofloxacin (CIPRO) 500 MG tablet Take 500 mg by mouth 2 (two) times daily. 10/25/14  Yes Historical Provider, MD  escitalopram (LEXAPRO) 10 MG tablet Take 10 mg by mouth daily. 10/26/14  Yes Historical Provider, MD  hydrochlorothiazide (HYDRODIURIL) 25 MG tablet Take 25 mg by mouth daily.   Yes Historical Provider, MD  Ibuprofen (ADVIL) 200 MG CAPS Take 200 mg by mouth every 6 (  six) hours as needed (pain, headache). Every 6 hours as needed for pain   Yes Historical Provider, MD  meclizine (ANTIVERT) 25 MG tablet Take 25 mg by mouth 3 (three) times daily as needed for dizziness. As needed   Yes Historical Provider, MD  ondansetron (ZOFRAN) 4 MG tablet Take 4 mg by mouth every 6 (six) hours as needed for nausea or vomiting.  10/28/14  Yes Historical Provider, MD  OXYCODONE HCL PO Take 5 mg by mouth 5 (five) times daily as needed (pain).    Yes Historical Provider, MD   Physical Exam: Filed Vitals:   10/29/14 2130 10/29/14 2200 10/29/14 2230 10/29/14 2235  BP: 139/89 130/40 120/34 104/90  Pulse: 65 62  64  Temp:      TempSrc:      Resp: 21 18    SpO2: 98% 98%  98%     General:  Awake and alert, ill appearing but NAD  Eyes: PERRL  bilaterally  ENT: No nasal drainage, mucous membranes slightly dry  Neck: supple  Cardiovascular: NR/RR  Respiratory: CTA bilaterally  Abdomen: abdomen soft, mild tenderness with exam but no significant guarding, bowel sounds are present  Skin: warm and dry  Musculoskeletal: No reproducible chest wall tenderness, moves all four extremities spontaneously  Psychiatric: Flat affect  Neurologic: No focal deficits  Labs on Admission:  Basic Metabolic Panel:  Recent Labs Lab 10/29/14 1802  NA 135  K 2.6*  CL 99*  CO2 22  GLUCOSE 112*  BUN 6  CREATININE 0.93  CALCIUM 9.6   Liver Function Tests:  Recent Labs Lab 10/29/14 1802  AST 41  ALT 39  ALKPHOS 97  BILITOT 1.2  PROT 7.0  ALBUMIN 3.9    Recent Labs Lab 10/29/14 1802  LIPASE 28   CBC:  Recent Labs Lab 10/29/14 1802  WBC 9.7  NEUTROABS 7.1  HGB 15.7*  HCT 43.5  MCV 83.7  PLT 224   Cardiac Enzymes:  Recent Labs Lab 10/29/14 1802  TROPONINI <0.03    Radiological Exams on Admission: Dg Chest 2 View  10/29/2014   CLINICAL DATA:  Pt states that she is having trouble catching her breath and feeling very weak for the past day, emesis this evening  EXAM: CHEST  2 VIEW  COMPARISON:  09/18/2003  FINDINGS: Cardiac silhouette is normal in size and configuration. Normal mediastinal and hilar contours.  Clear lungs.  No pleural effusion or pneumothorax.  There is degenerative spurring along the thoracic spine.  IMPRESSION: No active cardiopulmonary disease.   Electronically Signed   By: Lajean Manes M.D.   On: 10/29/2014 18:22    EKG: Independently reviewed. NSR, no acute ST segment changes  Assessment/Plan Principal Problem:   Chest pain Active Problems:   Hypokalemia   Numbness and tingling in hands   Nausea and vomiting   Loss of weight   1. Admit for observation, telemetry  2.  Chest pain --Serial cardiac enzymes --Complete echo in AM --A1c and lipid panel for risk factor  stratification  3.  Hypokalemia likely secondary to poor PO intake and increased GI losses --IV and PO replacement ordered, follow up AM BMP  4.  Parasthesias, likely related to electrolyte abnormalities but will also check thyroid function tests, B12, folate.  Presentation atypical for acute stroke so head CT deferred at this time, though she may need further neurological evaluation in the future.  5.  Nausea, vomiting, diarrhea, weight loss --Anti-emetics for now --Clear liquid diet --Monitor stools,  defer stool studies for now --Abdominal ultrasound in AM --Gentle IV fluid resuscitation --Consider GI consult/referral once electrolytes are corrected and she is ruled out for acute coronary syndrome   Code Status:FULL Family Communication:Sister at bedside Disposition Plan:To be determined based on response to initial therapies  Time spent: 70 minutes  [employer] [facility]  10/29/2014, 11:41 PM

## 2014-10-29 NOTE — ED Notes (Signed)
MD at bedside. 

## 2014-10-29 NOTE — ED Provider Notes (Signed)
CSN: [id number]     Arrival date & time 10/29/14  1719 History   First MD Initiated Contact with Patient 10/29/14 2004     Chief Complaint  Patient presents with  . multiple complaints       Patient is a 63 y.o. female presenting with diarrhea. The history is provided by the patient and a relative.  Diarrhea Quality:  Watery Severity:  Moderate Onset quality:  Gradual Duration:  2 weeks Timing:  Intermittent Progression:  Worsening Relieved by:  Nothing Worsened by:  Nothing tried Associated symptoms: abdominal pain and vomiting   Associated symptoms: no fever   Risk factors: no travel to endemic areas   pt presents for multiple complaints: Pt has had diarrhea for two weeks - it is nonbloody She also reports generalized fatigue She also reports tingling/numbness to both arms  She also reports increased CP/SOB at times - she describes it as heavy but none at this time  She reports increased stress due to multiple family members that are ill and recent death of her grandson this past summer  She was recently found to have UTI and taking cipro per her PCP but had diarrhea before taking cipro  Past Medical History  Diagnosis Date  . Hypertension   . Lyme disease   . Dizziness   . Depression   . Arthritis   . Back pain   . Knee pain   . Numbness   . Polyarthralgia   . Anxiety    Past Surgical History  Procedure Laterality Date  . Dilation and curettage of uterus  2004  . Tonsillectomy and adenoidectomy      age 28  . Leg surgery Right 2015    skin cancer off the lateral aspect    Family History  Problem Relation Age of Onset  . CAD Mother   . Diabetes Mother   . Fibromyalgia Mother   . Osteoarthritis Mother   . Stroke Mother   . Heart attack Father    Social History  Substance Use Topics  . Smoking status: Never Smoker   . Smokeless tobacco: Never Used  . Alcohol Use: No   OB History    No data available     Review of Systems  Constitutional:  Positive for fatigue and unexpected weight change. Negative for fever.  Respiratory: Positive for shortness of breath.   Cardiovascular: Positive for chest pain.  Gastrointestinal: Positive for nausea, vomiting, abdominal pain and diarrhea. Negative for blood in stool.  Neurological: Positive for weakness.  Psychiatric/Behavioral: Positive for dysphoric mood.  All other systems reviewed and are negative.     Allergies  Penicillins and Prednisone  Home Medications   Prior to Admission medications   Medication Sig Start Date End Date Taking? Authorizing Provider  ALPRAZolam Duanne Moron) 1 MG tablet Take 1 mg by mouth 3 (three) times daily as needed for anxiety.  10/26/14  Yes Historical Provider, MD  carvedilol (COREG) 3.125 MG tablet Take 3.125 mg by mouth daily.    Yes Historical Provider, MD  hydrochlorothiazide (HYDRODIURIL) 25 MG tablet Take 25 mg by mouth daily.   Yes Historical Provider, MD  OXYCODONE HCL PO Take 5 mg by mouth 5 (five) times daily as needed (pain).    Yes Historical Provider, MD  amphetamine-dextroamphetamine (ADDERALL) 10 MG tablet Take 10 mg by mouth daily with breakfast.    Historical Provider, MD  Biotin (BIOTIN MAXIMUM STRENGTH) 10 MG TABS Take by mouth 2 (two) times daily. 10000 mcg ,  2 tablets once daily    Historical Provider, MD  Calcium Carbonate-Vitamin D (CALTRATE 600+D PO) Take by mouth. 600-400mg -unit, one tablet with food once daily    Historical Provider, MD  CITALOPRAM HYDROBROMIDE PO Take 40 mg by mouth daily.    Historical Provider, MD  escitalopram (LEXAPRO) 10 MG tablet Take 10 mg by mouth daily. 10/26/14   Historical Provider, MD  Ibuprofen (ADVIL) 200 MG CAPS Take 200 mg by mouth as needed. Every 6 hours as needed for pain    Historical Provider, MD  meclizine (ANTIVERT) 25 MG tablet Take 25 mg by mouth 4 (four) times daily. As needed    Historical Provider, MD  methocarbamol (ROBAXIN) 500 MG tablet Take 500 mg by mouth 4 (four) times daily. As  needed    Historical Provider, MD  Multiple Vitamin (MULTIVITAMIN) capsule Take 1 capsule by mouth daily.    Historical Provider, MD   BP 130/40 mmHg  Pulse 62  Temp(Src) 98.1 F (36.7 C) (Oral)  Resp 18  SpO2 98% Physical Exam CONSTITUTIONAL: Well developed/well nourished HEAD: Normocephalic/atraumatic EYES: EOMI/PERRL ENMT: Mucous membranes dry NECK: supple no meningeal signs SPINE/BACK:entire spine nontender CV: S1/S2 noted, no murmurs/rubs/gallops noted LUNGS: Lungs are clear to auscultation bilaterally, no apparent distress ABDOMEN: soft, nontender, no rebound or guarding, bowel sounds noted throughout abdomen GU:no cva tenderness NEURO: Pt is awake/alert/appropriate, moves all extremitiesx4.  No facial droop.   EXTREMITIES: pulses normal/equal, full ROM SKIN: warm, color normal PSYCH: pt appears depressed  ED Course  Procedures  10:31 PM Pt with multiple issues - she is having episodes of CP/SOB, none at this time but she would benefit from admission for ACS evaluation Also reports episodes of diarrhea for 2 weeks and is dehydrated and also hypokalemic Will admit Pt given IV/PO potassium D/w dr Eulas Post will admit to med tele Patient/family updated on plan Medications  sodium chloride 0.9 % bolus 500 mL (0 mLs Intravenous Stopped 10/29/14 2135)  potassium chloride SA (K-DUR,KLOR-CON) CR tablet 40 mEq (40 mEq Oral Given 10/29/14 2036)  potassium chloride 10 mEq in 100 mL IVPB (0 mEq Intravenous Stopped 10/29/14 2220)  ondansetron (ZOFRAN) injection 4 mg (4 mg Intravenous Given 10/29/14 2035)    Labs Review Labs Reviewed  COMPREHENSIVE METABOLIC PANEL - Abnormal; Notable for the following:    Potassium 2.6 (*)    Chloride 99 (*)    Glucose, Bld 112 (*)    All other components within normal limits  CBC WITH DIFFERENTIAL/PLATELET - Abnormal; Notable for the following:    RBC 5.20 (*)    Hemoglobin 15.7 (*)    MCHC 36.1 (*)    All other components within normal limits   LIPASE, BLOOD  URINALYSIS, ROUTINE W REFLEX MICROSCOPIC (NOT AT [facility])  TROPONIN I    Imaging Review Dg Chest 2 View  10/29/2014   CLINICAL DATA:  Pt states that she is having trouble catching her breath and feeling very weak for the past day, emesis this evening  EXAM: CHEST  2 VIEW  COMPARISON:  09/18/2003  FINDINGS: Cardiac silhouette is normal in size and configuration. Normal mediastinal and hilar contours.  Clear lungs.  No pleural effusion or pneumothorax.  There is degenerative spurring along the thoracic spine.  IMPRESSION: No active cardiopulmonary disease.   Electronically Signed   By: Lajean Manes M.D.   On: 10/29/2014 18:22   I have personally reviewed and evaluated these images and lab results as part of my medical decision-making.   EKG  Interpretation   Date/Time:  Friday October 29 2014 17:28:16 EDT Ventricular Rate:  75 PR Interval:  170 QRS Duration: 84 QT Interval:  390 QTC Calculation: 435 R Axis:   28 Text Interpretation:  Normal sinus rhythm ST \\T \ T wave abnormality,  consider inferior ischemia Abnormal ECG artifact noted Confirmed by  Christy Gentles  MD, DONALD ([id number]) on 10/29/2014 7:26:24 PM      MDM   Final diagnoses:  Chest pain, rule out acute myocardial infarction  Diarrhea  Hypokalemia    Nursing notes including past medical history and social history reviewed and considered in documentation xrays/imaging reviewed by myself and considered during evaluation Labs/vital reviewed myself and considered during evaluation     Ripley Fraise, MD 10/29/14 2232

## 2014-10-29 NOTE — ED Notes (Signed)
The pts grandson died the first of 2022-09-10.  She is de[pressed  She is c/o chest tightness and tingling in both arms.  She  Has also had n and v  She has lost 30 lbs since her grandsons death.  She feels weak she keeps both her eyes closed when shes talking.  She was seen by her doctor Tuesday and dxd with a uti.  Marland Kitchen  She was given new depression med and a  Antibiotic.  She has not taken the depression med or the antibiotic.  She has only taken her bp med..  She has had diarrhea also.

## 2014-10-30 ENCOUNTER — Observation Stay (HOSPITAL_COMMUNITY): Payer: 59

## 2014-10-30 ENCOUNTER — Encounter (HOSPITAL_COMMUNITY): Payer: Self-pay | Admitting: Radiology

## 2014-10-30 DIAGNOSIS — R112 Nausea with vomiting, unspecified: Secondary | ICD-10-CM | POA: Diagnosis not present

## 2014-10-30 DIAGNOSIS — R634 Abnormal weight loss: Secondary | ICD-10-CM | POA: Diagnosis not present

## 2014-10-30 DIAGNOSIS — E876 Hypokalemia: Secondary | ICD-10-CM

## 2014-10-30 DIAGNOSIS — R202 Paresthesia of skin: Secondary | ICD-10-CM

## 2014-10-30 DIAGNOSIS — R079 Chest pain, unspecified: Secondary | ICD-10-CM | POA: Diagnosis not present

## 2014-10-30 DIAGNOSIS — R200 Anesthesia of skin: Secondary | ICD-10-CM

## 2014-10-30 LAB — LIPID PANEL
CHOL/HDL RATIO: 5 ratio
Cholesterol: 179 mg/dL (ref 0–200)
HDL: 36 mg/dL — AB (ref >40)
LDL CALC: 125 mg/dL — AB (ref 0–99)
Triglycerides: 92 mg/dL (ref <150)
VLDL: 18 mg/dL (ref 0–40)

## 2014-10-30 LAB — CK TOTAL AND CKMB (NOT AT ARMC)
CK TOTAL: 37 U/L — AB (ref 38–234)
CK TOTAL: 42 U/L (ref 38–234)
CK, MB: 1.3 ng/mL (ref 0.5–5.0)
CK, MB: 1.8 ng/mL (ref 0.5–5.0)
RELATIVE INDEX: INVALID (ref 0.0–2.5)
Relative Index: INVALID (ref 0.0–2.5)

## 2014-10-30 LAB — MAGNESIUM: MAGNESIUM: 1.6 mg/dL — AB (ref 1.7–2.4)

## 2014-10-30 LAB — CBC
HCT: 42.6 % (ref 36.0–46.0)
HEMATOCRIT: 40.5 % (ref 36.0–46.0)
Hemoglobin: 15.4 g/dL — ABNORMAL HIGH (ref 12.0–15.0)
Hemoglobin: 14.1 g/dL (ref 12.0–15.0)
MCH: 30.3 pg (ref 26.0–34.0)
MCH: 30 pg (ref 26.0–34.0)
MCHC: 36.2 g/dL — AB (ref 30.0–36.0)
MCHC: 34.8 g/dL (ref 30.0–36.0)
MCV: 83.9 fL (ref 78.0–100.0)
MCV: 86.2 fL (ref 78.0–100.0)
PLATELETS: 220 10*3/uL (ref 150–400)
PLATELETS: 186 10*3/uL (ref 150–400)
RBC: 5.08 MIL/uL (ref 3.87–5.11)
RBC: 4.7 MIL/uL (ref 3.87–5.11)
RDW: 13.1 % (ref 11.5–15.5)
RDW: 13.3 % (ref 11.5–15.5)
WBC: 10.6 10*3/uL — ABNORMAL HIGH (ref 4.0–10.5)
WBC: 7.2 10*3/uL (ref 4.0–10.5)

## 2014-10-30 LAB — BASIC METABOLIC PANEL
Anion gap: 7 (ref 5–15)
BUN: 5 mg/dL — AB (ref 6–20)
CHLORIDE: 105 mmol/L (ref 101–111)
CO2: 24 mmol/L (ref 22–32)
CREATININE: 0.83 mg/dL (ref 0.44–1.00)
Calcium: 8.6 mg/dL — ABNORMAL LOW (ref 8.9–10.3)
GFR calc Af Amer: >60 mL/min (ref >60)
GFR calc non Af Amer: >60 mL/min (ref >60)
GLUCOSE: 136 mg/dL — AB (ref 65–99)
POTASSIUM: 3.2 mmol/L — AB (ref 3.5–5.1)
SODIUM: 136 mmol/L (ref 135–145)

## 2014-10-30 LAB — CREATININE, SERUM
CREATININE: 0.9 mg/dL (ref 0.44–1.00)
GFR calc Af Amer: >60 mL/min (ref >60)
GFR calc non Af Amer: >60 mL/min (ref >60)

## 2014-10-30 LAB — TROPONIN I

## 2014-10-30 LAB — TSH: TSH: 5.142 u[IU]/mL — ABNORMAL HIGH (ref 0.350–4.500)

## 2014-10-30 LAB — VITAMIN B12: VITAMIN B 12: 769 pg/mL (ref 180–914)

## 2014-10-30 LAB — C DIFFICILE QUICK SCREEN W PCR REFLEX
C Diff antigen: NEGATIVE
C Diff interpretation: NEGATIVE
C Diff toxin: NEGATIVE

## 2014-10-30 LAB — T4, FREE: Free T4: 1.16 ng/dL — ABNORMAL HIGH (ref 0.61–1.12)

## 2014-10-30 MED ORDER — OXYCODONE HCL 5 MG PO TABS: 5.0000 mg | ORAL_TABLET | Freq: Every day | ORAL | Status: DC | PRN

## 2014-10-30 MED ORDER — IOHEXOL 300 MG/ML  SOLN
25.0000 mL | INTRAMUSCULAR | Status: AC
  Administered 2014-10-30 (×2): 25 mL via ORAL

## 2014-10-30 MED ORDER — ALPRAZOLAM 0.5 MG PO TABS
1.0000 mg | ORAL_TABLET | Freq: Three times a day (TID) | ORAL | Status: DC | PRN
  Administered 2014-10-30 (×2): 1 mg via ORAL
  Filled 2014-10-30 (×2): qty 2

## 2014-10-30 MED ORDER — BOOST / RESOURCE BREEZE PO LIQD
1.0000 | Freq: Three times a day (TID) | ORAL | Status: DC
  Administered 2014-10-30 – 2014-10-31 (×3): 1 via ORAL

## 2014-10-30 MED ORDER — POTASSIUM CHLORIDE CRYS ER 20 MEQ PO TBCR
40.0000 meq | EXTENDED_RELEASE_TABLET | Freq: Once | ORAL | Status: AC
  Administered 2014-10-30: 40 meq via ORAL
  Filled 2014-10-30: qty 2

## 2014-10-30 MED ORDER — DEXTROSE 5 % IV SOLN
3.0000 g | Freq: Once | INTRAVENOUS | Status: AC
  Administered 2014-10-30: 3 g via INTRAVENOUS
  Filled 2014-10-30: qty 6

## 2014-10-30 MED ORDER — ONDANSETRON HCL 4 MG/2ML IJ SOLN: 4.0000 mg | Freq: Four times a day (QID) | INTRAMUSCULAR | Status: DC | PRN

## 2014-10-30 MED ORDER — IOHEXOL 300 MG/ML  SOLN
100.0000 mL | Freq: Once | INTRAMUSCULAR | Status: AC | PRN
  Administered 2014-10-30: 100 mL via INTRAVENOUS

## 2014-10-30 MED ORDER — POTASSIUM CHLORIDE CRYS ER 20 MEQ PO TBCR
40.0000 meq | EXTENDED_RELEASE_TABLET | ORAL | Status: AC
  Administered 2014-10-30 (×2): 40 meq via ORAL
  Filled 2014-10-30 (×2): qty 2

## 2014-10-30 MED ORDER — ENOXAPARIN SODIUM 40 MG/0.4ML ~~LOC~~ SOLN
40.0000 mg | Freq: Every day | SUBCUTANEOUS | Status: DC
  Administered 2014-10-30 – 2014-10-31 (×2): 40 mg via SUBCUTANEOUS
  Filled 2014-10-30 (×2): qty 0.4

## 2014-10-30 MED ORDER — ATORVASTATIN CALCIUM 10 MG PO TABS
10.0000 mg | ORAL_TABLET | Freq: Every day | ORAL | Status: DC
  Administered 2014-10-30: 10 mg via ORAL
  Filled 2014-10-30: qty 1

## 2014-10-30 MED ORDER — POTASSIUM CHLORIDE IN NACL 40-0.9 MEQ/L-% IV SOLN
INTRAVENOUS | Status: AC
  Administered 2014-10-30: 100 mL/h via INTRAVENOUS
  Filled 2014-10-30: qty 1000

## 2014-10-30 MED ORDER — ACETAMINOPHEN 325 MG PO TABS
650.0000 mg | ORAL_TABLET | ORAL | Status: DC | PRN
  Administered 2014-10-31: 650 mg via ORAL
  Filled 2014-10-30: qty 2

## 2014-10-30 MED ORDER — ESCITALOPRAM OXALATE 10 MG PO TABS
10.0000 mg | ORAL_TABLET | Freq: Every day | ORAL | Status: DC
  Administered 2014-10-30 – 2014-10-31 (×2): 10 mg via ORAL
  Filled 2014-10-30 (×2): qty 1

## 2014-10-30 MED ORDER — CARVEDILOL 3.125 MG PO TABS
3.1250 mg | ORAL_TABLET | Freq: Two times a day (BID) | ORAL | Status: DC
  Administered 2014-10-30 – 2014-10-31 (×3): 3.125 mg via ORAL
  Filled 2014-10-30 (×3): qty 1

## 2014-10-30 MED ORDER — PANTOPRAZOLE SODIUM 40 MG PO TBEC
40.0000 mg | DELAYED_RELEASE_TABLET | Freq: Every day | ORAL | Status: DC
  Administered 2014-10-30 – 2014-10-31 (×2): 40 mg via ORAL
  Filled 2014-10-30: qty 1

## 2014-10-30 NOTE — Progress Notes (Signed)
Initial Nutrition Assessment  DOCUMENTATION CODES:   Severe malnutrition in context of acute illness/injury, Obesity unspecified  INTERVENTION:   Post procedure: Continue Boost Breeze po TID, each supplement provides 250 kcal and 9 grams of protein.  Encourage adequate PO intake.   NUTRITION DIAGNOSIS:   Malnutrition related to acute illness as evidenced by energy intake < or equal to 50% for > or equal to 5 days, percent weight loss.  GOAL:   Patient will meet greater than or equal to 90% of their needs  MONITOR:   PO intake, Supplement acceptance, Weight trends, Labs, I & O's  REASON FOR ASSESSMENT:   Malnutrition Screening Tool    ASSESSMENT:   64 y.o. woman with a history of HTN, osteoarthritis, and lumbar stenosis also reports decreased appetite and intermittent nonbloody emesis. She has had diarrhea for the past two weeks. Presents with chest pain.   Pt reports having a decreased appetite since July of this year. Pt reports she has only been able to consume one meal a day. Pt endorses weight loss of 20 lbs since July. Pt with a 9.5% weight loss in 2 months. Plans for procedure later today. Pt reports she is unable to eat until after the procedure. Pt currently has Boost Breeze ordered. RD to continue with current orders. Pt does however reports bring hungry during time of visit. Pt with no observed significant fat or muscle mass loss.   Labs: Low potassium, magnesium, BUN, and calcium.  Diet Order:  Diet clear liquid Room service appropriate?: Yes; Fluid consistency:: Thin  Skin:  Reviewed, no issues  Last BM:  9/16  Height:   Ht Readings from Last 1 Encounters:  02/23/14 5\' 4"  (1.626 m)    Weight:   Wt Readings from Last 1 Encounters:  10/30/14 199 lb 14.4 oz (90.674 kg)    Ideal Body Weight:  54.5 kg  BMI:  Body mass index is 34.3 kg/(m^2).  Estimated Nutritional Needs:   Kcal:  1800-2000  Protein:  90-100 grams  Fluid:  1.8 - 2  L/day  EDUCATION NEEDS:   No education needs identified at this time  Corrin Parker, MS, RD, LDN Pager # [phone] After hours/ weekend pager # [phone]

## 2014-10-30 NOTE — Progress Notes (Signed)
[facility]                                                                              Patient Demographics  Chloe Odom, is a 64 y.o. female, DOB - [birth year], ZOX:[id number]  Admit date - 10/29/2014   Admitting Physician Chloe Kocher, MD  Outpatient Primary MD for the patient is Chloe Gravel, MD  LOS -    Chief Complaint  Patient presents with  . multiple complaints        Brief HPI  Chloe Odom is a 64 y.o. woman with a history of HTN, osteoarthritis, and lumbar stenosis who has not been well since the death of her grandson in early July. She has had recurring episodes of substernal chest tightness. The episodes have happened with exertion and at rest. The pain dose not radiate and typically lasts 3-5 minutes at a time. No associated diaphoresis but she sometimes feels short of breath with these episodes. She has had nausea that she does not relate to her chest pain. She also reports decreased appetite and intermittent nonbloody emesis. She has had diarrhea for the past two weeks. She has had some dizziness but no syncope. She has lost 20 lbs in the past month. She received a prescription for cipro earlier this week, for "bladder infection", but she only took a few doses because the antibiotic worsened her nausea. She ultimately decided to come to the ED because she has also had two days of worsening parasthesias in both hands, in addition to her other symptoms, as outlined above. No fever. No sick contacts. She admits to high stress levels. Her daughter was recently diagnosed with CAD, requiring stents    Assessment & Plan    Principal Problem:   Chest pain - Currently resolved, troponins 2 negative, EKG with T-wave inversion in the inferior leads - 2-D echo ordered, pending  Active Problems:  Paresthesias, Hypokalemia - Currently paresthesias resolved, BMET/potassium is still pending. Also magnesium ordered    Nausea and vomiting, diarrhea,  Loss of weight - Obtain CT abdomen and pelvis, C. difficile panel, GI pathogen panel - Continue gentle hydration, pain control, IV antiemetics  Hyperlipidemia LDL 125, will place on low-dose statins  Acute anxiety - Continue Xanax as needed, Lexapro  Code Status: Full code  Family Communication: Discussed in detail with the patient, all imaging results, lab results explained to the patient and granddaughter at the bedside    Disposition Plan: Hopefully DC home in a.m. if stable  Time Spent in minutes   72minutes  Procedures  2-D echo  Consults   None  DVT Prophylaxis Lovenox  Medications  Scheduled Meds: . carvedilol  3.125 mg Oral BID WC  . enoxaparin (LOVENOX) injection  40 mg Subcutaneous Daily  . escitalopram  10 mg Oral Daily  . feeding supplement  1 Container Oral TID BM  . pantoprazole  40 mg Oral Daily   Continuous Infusions: . 0.9 % NaCl with KCl 40 mEq / L 100 mL/hr (10/30/14 0120)   PRN Meds:.acetaminophen, ALPRAZolam, ondansetron (ZOFRAN) IV, oxyCODONE   Antibiotics   Anti-infectives    None  Subjective:   Chloe Odom was seen and examined today.  Feeling somewhat better today, still having diarrhea, 3 episodes last night per patient.  Patient denies dizziness, chest pain, shortness of breath, new weakness, numbess, tingling. No acute events overnight.    Objective:   Blood pressure 129/71, pulse 58, temperature 98.1 F (36.7 C), temperature source Axillary, resp. rate 18, weight 90.674 kg (199 lb 14.4 oz), SpO2 100 %.  Wt Readings from Last 3 Encounters:  10/30/14 90.674 kg (199 lb 14.4 oz)  02/23/14 99.791 kg (220 lb)     Intake/Output Summary (Last 24 hours) at 10/30/14 1103 Last data filed at 10/30/14 1031  Gross per 24 hour  Intake   1885 ml  Output      0 ml  Net   1885 ml    Exam  General: Alert and oriented x 3, NAD  HEENT:  PERRLA, EOMI, Anicteric Sclera, mucous membranes moist.   Neck: Supple, no JVD, no  masses  CVS: S1 S2 auscultated, no rubs, murmurs or gallops. Regular rate and rhythm.  Respiratory: Clear to auscultation bilaterally, no wheezing, rales or rhonchi  Abdomen: Soft, nontender, nondistended, + bowel sounds  Ext: no cyanosis clubbing or edema  Neuro: AAOx3, Cr N's II- XII. Strength 5/5 upper and lower extremities bilaterally  Skin: No rashes  Psych: Normal affect and demeanor, alert and oriented x3    Data Review   Micro Results No results found for this or any previous visit (from the past 240 hour(s)).  Radiology Reports Dg Chest 2 View  10/29/2014   CLINICAL DATA:  Pt states that she is having trouble catching her breath and feeling very weak for the past day, emesis this evening  EXAM: CHEST  2 VIEW  COMPARISON:  09/18/2003  FINDINGS: Cardiac silhouette is normal in size and configuration. Normal mediastinal and hilar contours.  Clear lungs.  No pleural effusion or pneumothorax.  There is degenerative spurring along the thoracic spine.  IMPRESSION: No active cardiopulmonary disease.   Electronically Signed   By: Lajean Manes M.D.   On: 10/29/2014 18:22    CBC  Recent Labs Lab 10/29/14 1802 10/30/14 0135  WBC 9.7 10.6*  HGB 15.7* 15.4*  HCT 43.5 42.6  PLT 224 220  MCV 83.7 83.9  MCH 30.2 30.3  MCHC 36.1* 36.2*  RDW 12.8 13.1  LYMPHSABS 1.9  --   MONOABS 0.6  --   EOSABS 0.0  --   BASOSABS 0.0  --     Chemistries   Recent Labs Lab 10/29/14 1802 10/30/14 0135  NA 135  --   K 2.6*  --   CL 99*  --   CO2 22  --   GLUCOSE 112*  --   BUN 6  --   CREATININE 0.93 0.90  CALCIUM 9.6  --   AST 41  --   ALT 39  --   ALKPHOS 97  --   BILITOT 1.2  --    ------------------------------------------------------------------------------------------------------------------ CrCl cannot be calculated (Unknown ideal weight.). ------------------------------------------------------------------------------------------------------------------ No results for  input(s): HGBA1C in the last 72 hours. ------------------------------------------------------------------------------------------------------------------  Recent Labs  10/30/14 0730  CHOL 179  HDL 36*  LDLCALC 125*  TRIG 92  CHOLHDL 5.0   ------------------------------------------------------------------------------------------------------------------  Recent Labs  10/30/14 0135  TSH 5.142*   ------------------------------------------------------------------------------------------------------------------  Recent Labs  10/30/14 0135  VITAMINB12 769    Coagulation profile No results for input(s): INR, PROTIME in the last 168 hours.  No results for input(s):  DDIMER in the last 72 hours.  Cardiac Enzymes  Recent Labs Lab 10/29/14 1802 10/30/14 0135  CKMB  --  1.3  TROPONINI <0.03 <0.03   ------------------------------------------------------------------------------------------------------------------ Invalid input(s): POCBNP  No results for input(s): GLUCAP in the last 72 hours.   RAI,RIPUDEEP M.D. [facility] 10/30/2014, 11:03 AM  Pager: [phone] Between 7am to 7pm - call Pager - [phone]  After 7pm go to [email or web address] - password [email or web address]  Call night coverage person covering after 7pm

## 2014-10-30 NOTE — Progress Notes (Signed)
  Echocardiogram 2D Echocardiogram has been performed.  Joelene Millin 10/30/2014, 9:50 AM

## 2014-10-31 DIAGNOSIS — R112 Nausea with vomiting, unspecified: Secondary | ICD-10-CM | POA: Diagnosis not present

## 2014-10-31 DIAGNOSIS — R079 Chest pain, unspecified: Secondary | ICD-10-CM | POA: Diagnosis not present

## 2014-10-31 DIAGNOSIS — R634 Abnormal weight loss: Secondary | ICD-10-CM | POA: Diagnosis not present

## 2014-10-31 DIAGNOSIS — E876 Hypokalemia: Secondary | ICD-10-CM | POA: Diagnosis not present

## 2014-10-31 LAB — BASIC METABOLIC PANEL
ANION GAP: 8 (ref 5–15)
BUN: <5 mg/dL — ABNORMAL LOW (ref 6–20)
CHLORIDE: 108 mmol/L (ref 101–111)
CO2: 24 mmol/L (ref 22–32)
Calcium: 8.6 mg/dL — ABNORMAL LOW (ref 8.9–10.3)
Creatinine, Ser: 0.78 mg/dL (ref 0.44–1.00)
GFR calc Af Amer: >60 mL/min (ref >60)
Glucose, Bld: 133 mg/dL — ABNORMAL HIGH (ref 65–99)
POTASSIUM: 3.5 mmol/L (ref 3.5–5.1)
SODIUM: 140 mmol/L (ref 135–145)

## 2014-10-31 LAB — URINE CULTURE: Culture: NO GROWTH

## 2014-10-31 LAB — MAGNESIUM: MAGNESIUM: 2.1 mg/dL (ref 1.7–2.4)

## 2014-10-31 MED ORDER — MAGNESIUM OXIDE 400 MG PO TABS: 400.0000 mg | ORAL_TABLET | Freq: Every day | ORAL | Status: DC

## 2014-10-31 MED ORDER — PANTOPRAZOLE SODIUM 40 MG PO TBEC: 40.0000 mg | DELAYED_RELEASE_TABLET | Freq: Every day | ORAL | Status: AC

## 2014-10-31 MED ORDER — ONDANSETRON HCL 4 MG PO TABS: 4.0000 mg | ORAL_TABLET | Freq: Four times a day (QID) | ORAL | Status: AC | PRN

## 2014-10-31 MED ORDER — POTASSIUM CHLORIDE ER 10 MEQ PO TBCR: 10.0000 meq | EXTENDED_RELEASE_TABLET | Freq: Every day | ORAL | Status: DC

## 2014-10-31 MED ORDER — ATORVASTATIN CALCIUM 10 MG PO TABS: 10.0000 mg | ORAL_TABLET | Freq: Every day | ORAL | Status: DC

## 2014-10-31 NOTE — Evaluation (Signed)
Physical Therapy Evaluation Patient Details Name: Chloe Odom MRN: [id number] DOB: May 28, 1950 Today's Date: 10/31/2014   History of Present Illness  Admitted with acute Hypokalemia; atypical Chest pain; Paresthesias secondary to hypokalemia; Hypomagnesemia; Nausea, vomiting, diarrhea likely due to gastroenteritis, resolved; Hyperlipidemia;Anxiety   Clinical Impression  Patient evaluated by Physical Therapy with no further acute PT needs identified. All education has been completed and the patient has no further questions.  PT is signing off. Thank you for this referral.  Discussed regular follow up with primary Care Physician.     Follow Up Recommendations No PT follow up    Equipment Recommendations  None recommended by PT    Recommendations for Other Services       Precautions / Restrictions Precautions Precautions: None Restrictions Weight Bearing Restrictions: No      Mobility  Bed Mobility Overal bed mobility: Independent                Transfers Overall transfer level: Independent Equipment used: None                Ambulation/Gait Ambulation/Gait assistance: Supervision;Modified independent (Device/Increase time) Ambulation Distance (Feet): 300 Feet Assistive device: None Gait Pattern/deviations: Step-through pattern     General Gait Details: Slow moving, initially and noted tendency to reach out fo rfuriture in the room; once in hallway, gait normalized, still slow-moving, but no gross loss of balance  Stairs            Wheelchair Mobility    Modified Rankin (Stroke Patients Only)       Balance Overall balance assessment: No apparent balance deficits (not formally assessed)                                           Pertinent Vitals/Pain Pain Assessment: No/denies pain    Home Living Family/patient expects to be discharged to:: Private residence Living Arrangements: Alone Available Help at Discharge:  Friend(s);Available PRN/intermittently Type of Home: House Home Access: Stairs to enter Entrance Stairs-Rails: Right Entrance Stairs-Number of Steps: 3 Home Layout: One level Home Equipment: None      Prior Function Level of Independence: Independent         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     Hand Dominance        Extremity/Trunk Assessment   Upper Extremity Assessment: Overall WFL for tasks assessed (paresthesias are gone)           Lower Extremity Assessment: Overall WFL for tasks assessed      Cervical / Trunk Assessment: Normal  Communication   Communication: No difficulties  Cognition Arousal/Alertness: Awake/alert Behavior During Therapy: WFL for tasks assessed/performed;Flat affect Overall Cognitive Status: Within Functional Limits for tasks assessed                      General Comments      Exercises        Assessment/Plan    PT Assessment Patent does not need any further PT services  PT Diagnosis Generalized weakness   PT Problem List    PT Treatment Interventions     PT Goals (Current goals can be found in the Care Plan section) Acute Rehab PT Goals Patient Stated Goal: feel better and get back to work PT Goal Formulation: All assessment and education complete, DC therapy    Frequency     Barriers to  discharge        Co-evaluation               End of Session   Activity Tolerance: Patient tolerated treatment well Patient left: in bed;with call bell/phone within reach Nurse Communication: Mobility status    Functional Assessment Tool Used: Clinical Judgement Functional Limitation: Mobility: Walking and moving around Mobility: Walking and Moving Around Current Status [phone]): 0 percent impaired, limited or restricted Mobility: Walking and Moving Around Goal Status [phone]): 0 percent impaired, limited or restricted Mobility: Walking and Moving Around Discharge Status [phone]): 0 percent impaired, limited or restricted     Time: 1205-1217 PT Time Calculation (min) (ACUTE ONLY): 12 min   Charges:   PT Evaluation $Initial PT Evaluation Tier I: 1 Procedure     PT G Codes:   PT G-Codes **NOT FOR INPATIENT CLASS** Functional Assessment Tool Used: Clinical Judgement Functional Limitation: Mobility: Walking and moving around Mobility: Walking and Moving Around Current Status ([id number]): 0 percent impaired, limited or restricted Mobility: Walking and Moving Around Goal Status ([id number]): 0 percent impaired, limited or restricted Mobility: Walking and Moving Around Discharge Status [phone]): 0 percent impaired, limited or restricted    Roney Marion Hamff 10/31/2014, 1:15 PM  Roney Marion, [place] Pager [phone] Office [phone]

## 2014-10-31 NOTE — Care Management (Signed)
CM met with patient at bedside, no care management needs identified. Patient being discharged to self-care. Patient will be transported home via private vehicle accompanied by family.

## 2014-10-31 NOTE — Discharge Summary (Signed)
Physician Discharge Summary   Patient ID: Chloe Odom MRN: [id number] DOB/AGE: [birth year] 64 y.o.  Admit date: 10/29/2014 Discharge date: 10/31/2014  Primary Care Physician:  Jani Gravel, MD  Discharge Diagnoses:    . acute Hypokalemia . atypical Chest pain Paresthesias secondary to hypokalemia  Hypomagnesemia  Nausea, vomiting, diarrhea likely due to gastroenteritis, resolved Hyperlipidemia  Anxiety    Consults:  none   Recommendations for Outpatient Follow-up:  Patient was recommended to stop Hydrochlorothiazide. Please check BMET at the time of follow-up appointment for potassium and magnesium.   TESTS THAT NEED FOLLOW-UP BMET   DIET: Heart healthy diet    Allergies:   Allergies  Allergen Reactions  . Penicillins     Childhood allergy  . Prednisone     jitters     Discharge Medications:   Medication List    STOP taking these medications        ADVIL 200 MG Caps  Generic drug:  Ibuprofen     ciprofloxacin 500 MG tablet  Commonly known as:  CIPRO     hydrochlorothiazide 25 MG tablet  Commonly known as:  HYDRODIURIL      TAKE these medications        ALPRAZolam 1 MG tablet  Commonly known as:  XANAX  Take 1 mg by mouth 3 (three) times daily as needed for anxiety.     atorvastatin 10 MG tablet  Commonly known as:  LIPITOR  Take 1 tablet (10 mg total) by mouth daily at 6 PM.     carvedilol 3.125 MG tablet  Commonly known as:  COREG  Take 3.125 mg by mouth 2 (two) times daily with a meal.     escitalopram 10 MG tablet  Commonly known as:  LEXAPRO  Take 10 mg by mouth daily.     magnesium oxide 400 MG tablet  Commonly known as:  MAG-OX  Take 1 tablet (400 mg total) by mouth daily.     meclizine 25 MG tablet  Commonly known as:  ANTIVERT  Take 25 mg by mouth 3 (three) times daily as needed for dizziness. As needed     ondansetron 4 MG tablet  Commonly known as:  ZOFRAN  Take 1 tablet (4 mg total) by mouth every 6 (six) hours as needed  for nausea or vomiting.     OXYCODONE HCL PO  Take 5 mg by mouth 5 (five) times daily as needed (pain).     pantoprazole 40 MG tablet  Commonly known as:  PROTONIX  Take 1 tablet (40 mg total) by mouth daily.     potassium chloride 10 MEQ tablet  Commonly known as:  K-DUR  Take 1 tablet (10 mEq total) by mouth daily.         Brief H and P: For complete details please refer to admission H and P, but in brief Chloe Odom is a 64 y.o. woman with a history of HTN, osteoarthritis, and lumbar stenosis who has not been well since the death of her grandson in early July. She has had recurring episodes of substernal chest tightness. The episodes have happened with exertion and at rest. The pain dose not radiate and typically lasts 3-5 minutes at a time. No associated diaphoresis but she sometimes feels short of breath with these episodes. She has had nausea that she does not relate to her chest pain. She also reports decreased appetite and intermittent nonbloody emesis. She has had diarrhea for the past two weeks. She has  had some dizziness but no syncope. She has lost 20 lbs in the past month. She received a prescription for cipro earlier this week, for "bladder infection", but she only took a few doses because the antibiotic worsened her nausea. She ultimately decided to come to the ED because she has also had two days of worsening parasthesias in both hands, in addition to her other symptoms, as outlined above. No fever. No sick contacts. She admits to high stress levels. Her daughter was recently diagnosed with CAD, requiring stents  Hospital Course:  Atypical Chest pain -Patient was admitted to telemetry floor. Serial troponins were obtained which were negative for any acute ACS. 2-D echo was obtained. EKG had shown nonspecific changes. 2-D echo showed EF of 65-70% with grade 1 diastolic dysfunction, trivial free-flowing pericardial effusion. Please follow closely, may need a  repeat 2-D echo in 3-6 months to follow pericardial effusion.   Paresthesias, Hypokalemia: Currently resolved, likely due to nausea, vomiting, diarrhea, HCTZ. - Potassium is 3.5 at the time of discharge, magnesium 2.1. Patient needed several replacements of potassium and IV magnesium replacement. Patient is given prescription for oral potassium and magnesium. Please follow BMET at the time of appointment. Patient was commended to stop HCTZ. She was also having nausea, vomiting and diarrhea prior to admission which has resolved.    Nausea and vomiting, diarrhea, Loss of weight - Resolved, patient is tolerating regular diet without any difficulty. The C. difficile negative. CT scan of the abdomen and pelvis was negative for any abdominal pathology. Patient was placed on IV fluid hydration at the time of admission.   Hyperlipidemia LDL 125, placed on low-dose statin  Acute anxiety - Continue Xanax as needed, Lexapro   Day of Discharge BP 116/57 mmHg  Pulse 67  Temp(Src) 98.1 F (36.7 C) (Axillary)  Resp 19  Wt 90.719 kg (200 lb)  SpO2 99%  Physical Exam: General: Alert and awake oriented x3 not in any acute distress. HEENT: anicteric sclera, pupils reactive to light and accommodation CVS: S1-S2 clear no murmur rubs or gallops Chest: clear to auscultation bilaterally, no wheezing rales or rhonchi Abdomen: soft nontender, nondistended, normal bowel sounds Extremities: no cyanosis, clubbing or edema noted bilaterally Neuro: Cranial nerves II-XII intact, no focal neurological deficits   The results of significant diagnostics from this hospitalization (including imaging, microbiology, ancillary and laboratory) are listed below for reference.    LAB RESULTS: Basic Metabolic Panel:  Recent Labs Lab 10/30/14 1054 10/31/14 0540  NA 136 140  K 3.2* 3.5  CL 105 108  CO2 24 24  GLUCOSE 136* 133*  BUN 5* <5*  CREATININE 0.83 0.78  CALCIUM 8.6* 8.6*  MG 1.6* 2.1   Liver  Function Tests:  Recent Labs Lab 10/29/14 1802  AST 41  ALT 39  ALKPHOS 97  BILITOT 1.2  PROT 7.0  ALBUMIN 3.9    Recent Labs Lab 10/29/14 1802  LIPASE 28   No results for input(s): AMMONIA in the last 168 hours. CBC:  Recent Labs Lab 10/29/14 1802 10/30/14 0135 10/30/14 1054  WBC 9.7 10.6* 7.2  NEUTROABS 7.1  --   --   HGB 15.7* 15.4* 14.1  HCT 43.5 42.6 40.5  MCV 83.7 83.9 86.2  PLT 224 220 186   Cardiac Enzymes:  Recent Labs Lab 10/30/14 0135 10/30/14 1054  CKTOTAL 37* 42  CKMB 1.3 1.8  TROPONINI <0.03 <0.03   BNP: Invalid input(s): POCBNP CBG: No results for input(s): GLUCAP in the last 168 hours.  Significant Diagnostic Studies:  Dg Chest 2 View  10/29/2014   CLINICAL DATA:  Pt states that she is having trouble catching her breath and feeling very weak for the past day, emesis this evening  EXAM: CHEST  2 VIEW  COMPARISON:  09/18/2003  FINDINGS: Cardiac silhouette is normal in size and configuration. Normal mediastinal and hilar contours.  Clear lungs.  No pleural effusion or pneumothorax.  There is degenerative spurring along the thoracic spine.  IMPRESSION: No active cardiopulmonary disease.   Electronically Signed   By: Lajean Manes M.D.   On: 10/29/2014 18:22   Ct Abdomen Pelvis W Contrast  10/30/2014   CLINICAL DATA:  Nausea, vomiting, diarrhea, weight loss  EXAM: CT ABDOMEN AND PELVIS WITH CONTRAST  TECHNIQUE: Multidetector CT imaging of the abdomen and pelvis was performed using the standard protocol following bolus administration of intravenous contrast.  CONTRAST:  18mL OMNIPAQUE IOHEXOL 300 MG/ML  SOLN  COMPARISON:  None.  FINDINGS: Lower chest:  Clear lung bases.  Normal heart size.  Hepatobiliary: Diffuse hepatic low attenuation as can be seen with hepatic steatosis. No focal hepatic mass. No intrahepatic or extrahepatic biliary ductal dilatation. Normal gallbladder.  Pancreas: Normal.  Spleen: Normal.  Adrenals/Urinary Tract: Normal adrenal  glands. Normal kidneys. Normal decompressed bladder. No urolithiasis or obstructive uropathy.  Stomach/Bowel: No bowel dilatation or bowel wall thickening. Diverticulosis without evidence of diverticulitis. Normal caliber appendix without periappendiceal inflammatory changes. No pneumatosis, pneumoperitoneum portal venous gas. No abdominal or pelvic free fluid.  Vascular/Lymphatic: Normal caliber abdominal aorta with atherosclerosis. No abdominal or pelvic lymphadenopathy.  Reproductive: Small uterine masses likely representing uterine fibroids. No adnexal mass. Cystic structure in the cervix likely representing a nabothian cyst.  Other: No fluid collection or hematoma.  Musculoskeletal: No lytic or sclerotic osseous lesion. No acute osseous abnormality. Mild osteoarthritis of bilateral hips. Bilateral facet arthropathy throughout the lumbar spine.  IMPRESSION: 1. Normal appendix. 2. Diverticulosis without evidence of diverticulitis. 3. Hepatic steatosis.   Electronically Signed   By: Kathreen Devoid   On: 10/30/2014 14:19    2D ECHO: Study Conclusions  - Left ventricle: The cavity size was normal. There was mild concentric hypertrophy. Systolic function was vigorous. The estimated ejection fraction was in the range of 65% to 70%. Wall motion was normal; there were no regional wall motion abnormalities. There was an increased relative contribution of atrial contraction to ventricular filling. Doppler parameters are consistent with abnormal left ventricular relaxation (grade 1 diastolic dysfunction). - Aortic valve: Poorly visualized. Trileaflet; normal thickness, mildly calcified leaflets. - Mitral valve: There was trivial regurgitation. - Pericardium, extracardiac: A trivial, free-flowing pericardial effusion was identified along the right ventricular free wall.  Disposition and Follow-up:     Discharge Instructions    Diet general    Complete by:  As directed      Increase  activity slowly    Complete by:  As directed             DISPOSITION: *Home   DISCHARGE FOLLOW-UP Follow-up Information    Follow up with Jani Gravel, MD. Schedule an appointment as soon as possible for a visit in 10 days.   Specialty:  Internal Medicine   Why:  for hospital follow-up, obtain labs for BMET/ potassium   Contact information:   [address] [place] [address] [phone]        Time spent on Discharge: 35 minutes   Signed:   RAI,RIPUDEEP M.D. [facility] 10/31/2014, 10:40 AM  Pager: [phone]

## 2014-10-31 NOTE — Progress Notes (Signed)
Patient Discharge: Disposition: Patient discharged to home with family. Education: Reviewed medications, prescriptions, follow-up appointment and discharge instructions.  Patient was also given had out on atypical chest pain, understood and acknowledged. IV: Discontinued IV before discharge. Telemetry: Discontinued before discharge, CCMD notified. Transportation: Patient was transported in w/c with the staff and family accompanying her out of the unit. Belongings: Patient took all her belongings with her.

## 2014-11-01 LAB — FOLATE RBC: Hematocrit: 42.7 % (ref 34.0–46.6)

## 2014-11-01 LAB — HEMOGLOBIN A1C
HEMOGLOBIN A1C: 5.9 % — AB (ref 4.8–5.6)
MEAN PLASMA GLUCOSE: 123 mg/dL

## 2014-11-02 LAB — FECAL LACTOFERRIN, QUANT: Fecal Lactoferrin: NEGATIVE

## 2014-11-02 LAB — OVA AND PARASITE EXAMINATION: OVA AND PARASITES: NONE SEEN

## 2014-11-03 LAB — GI PATHOGEN PANEL BY PCR, STOOL
C difficile toxin A/B: NOT DETECTED
CRYPTOSPORIDIUM BY PCR: NOT DETECTED
Campylobacter by PCR: NOT DETECTED
E COLI (ETEC) LT/ST: NOT DETECTED
E COLI 0157 BY PCR: NOT DETECTED
E coli (STEC): NOT DETECTED
G LAMBLIA BY PCR: NOT DETECTED
Norovirus GI/GII: NOT DETECTED
Rotavirus A by PCR: NOT DETECTED
Salmonella by PCR: NOT DETECTED
Shigella by PCR: NOT DETECTED

## 2014-11-03 LAB — STOOL CULTURE

## 2015-02-21 ENCOUNTER — Other Ambulatory Visit: Payer: Self-pay | Admitting: Internal Medicine

## 2015-02-21 DIAGNOSIS — R945 Abnormal results of liver function studies: Secondary | ICD-10-CM

## 2015-03-01 ENCOUNTER — Ambulatory Visit
Admission: RE | Admit: 2015-03-01 | Discharge: 2015-03-01 | Disposition: A | Discharge status: Home / Self Care | Payer: BLUE CROSS/BLUE SHIELD | Source: Ambulatory Visit | Attending: Internal Medicine | Admitting: Internal Medicine

## 2015-03-01 DIAGNOSIS — R945 Abnormal results of liver function studies: Secondary | ICD-10-CM

## 2015-03-22 ENCOUNTER — Other Ambulatory Visit: Payer: Self-pay | Admitting: Registered Nurse

## 2015-03-22 ENCOUNTER — Other Ambulatory Visit (HOSPITAL_COMMUNITY)
Admission: RE | Admit: 2015-03-22 | Discharge: 2015-03-22 | Disposition: A | Discharge status: Home / Self Care | Payer: BLUE CROSS/BLUE SHIELD | Source: Ambulatory Visit | Attending: Internal Medicine | Admitting: Internal Medicine

## 2015-03-22 ENCOUNTER — Encounter: Payer: Self-pay | Admitting: Internal Medicine

## 2015-03-22 DIAGNOSIS — Z01419 Encounter for gynecological examination (general) (routine) without abnormal findings: Secondary | ICD-10-CM | POA: Diagnosis present

## 2015-03-25 LAB — CYTOLOGY - PAP

## 2015-04-25 ENCOUNTER — Ambulatory Visit (AMBULATORY_SURGERY_CENTER): Payer: Self-pay | Admitting: *Deleted

## 2015-04-25 VITALS — Ht 64.0 in | Wt 210.0 lb

## 2015-04-25 DIAGNOSIS — Z1211 Encounter for screening for malignant neoplasm of colon: Secondary | ICD-10-CM

## 2015-04-25 NOTE — Progress Notes (Signed)
Patient denies any allergies to egg or soy products. Patient denies complications with anesthesia/sedation.  Patient denies oxygen use at home and denies diet medications. Emmi instructions for colonoscopy explained but patient denied.     

## 2015-05-09 ENCOUNTER — Ambulatory Visit (AMBULATORY_SURGERY_CENTER): Payer: BLUE CROSS/BLUE SHIELD | Admitting: Internal Medicine

## 2015-05-09 ENCOUNTER — Encounter: Payer: Self-pay | Admitting: Internal Medicine

## 2015-05-09 VITALS — BP 142/67 | HR 51 | Temp 98.4°F | Resp 21 | Ht 64.0 in | Wt 210.0 lb

## 2015-05-09 DIAGNOSIS — K635 Polyp of colon: Secondary | ICD-10-CM | POA: Diagnosis not present

## 2015-05-09 DIAGNOSIS — D124 Benign neoplasm of descending colon: Secondary | ICD-10-CM | POA: Diagnosis not present

## 2015-05-09 DIAGNOSIS — D123 Benign neoplasm of transverse colon: Secondary | ICD-10-CM

## 2015-05-09 DIAGNOSIS — D120 Benign neoplasm of cecum: Secondary | ICD-10-CM

## 2015-05-09 DIAGNOSIS — Z1211 Encounter for screening for malignant neoplasm of colon: Secondary | ICD-10-CM

## 2015-05-09 MED ORDER — SODIUM CHLORIDE 0.9 % IV SOLN
500.0000 mL | INTRAVENOUS | Status: DC
Start: 2015-05-09 — End: 2015-05-09

## 2015-05-09 NOTE — Op Note (Signed)
[place] Patient Name: Chloe Odom Procedure Date: 05/09/2015 2:41 PM MRN: [id number] Endoscopist: Chloe Odom , MD Age: 65 Referring MD:  Date of Birth: [birth year] Gender: Female Procedure:                Colonoscopy Indications:              Screening for colorectal malignant neoplasm, This                            is the patient's first colonoscopy Medicines:                Monitored Anesthesia Care Procedure:                Pre-Anesthesia Assessment:                           - Prior to the procedure, a History and Physical                            was performed, and patient medications and                            allergies were reviewed. The patient's tolerance of                            previous anesthesia was also reviewed. The risks                            and benefits of the procedure and the sedation                            options and risks were discussed with the patient.                            All questions were answered, and informed consent                            was obtained. Prior Anticoagulants: The patient has                            taken no previous anticoagulant or antiplatelet                            agents. ASA Grade Assessment: II - A patient with                            mild systemic disease. After reviewing the risks                            and benefits, the patient was deemed in                            satisfactory condition to undergo the procedure.  After obtaining informed consent, the colonoscope                            was passed under direct vision. Throughout the                            procedure, the patient's blood pressure, pulse, and                            oxygen saturations were monitored continuously. The                            Model CF-HQ190L [id number]) scope was introduced                            through the anus and advanced to the the cecum,                           identified by appendiceal orifice and ileocecal                            valve. The colonoscopy was performed without                            difficulty. The patient tolerated the procedure                            well. The quality of the bowel preparation was                            good. The ileocecal valve, appendiceal orifice, and                            rectum were photographed. Scope In: 2:52:20 PM Scope Out: 3:16:03 PM Scope Withdrawal Time: 0 hours 18 minutes 5 seconds  Total Procedure Duration: 0 hours 23 minutes 43 seconds  Findings:      The digital rectal exam was normal.      A 5 mm polyp was found in the cecum. The polyp was semi-pedunculated.       The polyp was removed with a cold snare. Resection and retrieval were       complete.      Two sessile polyps were found in the transverse colon. The polyps were 4       to 5 mm in size. These polyps were removed with a cold snare. Resection       and retrieval were complete.      Two sessile polyps were found in the descending colon. The polyps were 4       to 6 mm in size. These polyps were removed with a cold snare. Resection       and retrieval were complete.      Multiple small and large-mouthed diverticula were found from transverse       colon to sigmoid colon.      The retroflexed view of the distal rectum and anal verge was normal and  showed no anal or rectal abnormalities. Complications:            No immediate complications. Estimated Blood Loss:     Estimated blood loss was minimal. Impression:               - One 5 mm polyp in the cecum, removed with a cold                            snare. Resected and retrieved.                           - Two 4 to 5 mm polyps in the transverse colon,                            removed with a cold snare. Resected and retrieved.                           - Two 4 to 6 mm polyps in the descending colon,                            removed  with a cold snare. Resected and retrieved.                           - Moderate diverticulosis from transverse colon to                            sigmoid colon.                           - The distal rectum and anal verge are normal on                            retroflexion view. Recommendation:           - Patient has a contact number available for                            emergencies. The signs and symptoms of potential                            delayed complications were discussed with the                            patient. Return to normal activities tomorrow.                            Written discharge instructions were provided to the                            patient.                           - Resume previous diet.                           -  Continue present medications.                           - Await pathology results.                           - Repeat colonoscopy date to be determined after                            pending pathology results are reviewed for                            surveillance based on pathology results. Procedure Code(s):        --- Professional ---                           [phone], Colonoscopy, flexible; with removal of                            tumor(s), polyp(s), or other lesion(s) by snare                            technique CPT copyright 2016 [employer]. All rights reserved. Chloe Odom. Chloe Fredrickson, MD Chloe Bears, MD 05/09/2015 3:20:53 PM This report has been signed electronically. Number of Addenda: 0 Referring MD:      Chloe Odom

## 2015-05-09 NOTE — Progress Notes (Signed)
Called to room to assist during endoscopic procedure.  Patient ID and intended procedure confirmed with present staff. Received instructions for my participation in the procedure from the performing physician.  

## 2015-05-09 NOTE — Progress Notes (Signed)
A/ox3 pleased with MAC, report to Penny RN 

## 2015-05-09 NOTE — Patient Instructions (Signed)

## 2015-05-10 ENCOUNTER — Telehealth: Payer: Self-pay

## 2015-05-10 NOTE — Telephone Encounter (Signed)
  Follow up Call-  Call back number 05/09/2015  Post procedure Call Back phone  # [phone]  Permission to leave phone message Yes     Patient questions:  Do you have a fever, pain , or abdominal swelling? No. Pain Score  0 *  Have you tolerated food without any problems? Yes.    Have you been able to return to your normal activities? Yes.    Do you have any questions about your discharge instructions: Diet   No. Medications  No. Follow up visit  No.  Do you have questions or concerns about your Care? No.  Actions: * If pain score is 4 or above: No action needed, pain <4.

## 2015-05-17 ENCOUNTER — Encounter: Payer: Self-pay | Admitting: Internal Medicine

## 2015-06-08 ENCOUNTER — Other Ambulatory Visit: Payer: Self-pay

## 2015-06-08 DIAGNOSIS — Z1231 Encounter for screening mammogram for malignant neoplasm of breast: Secondary | ICD-10-CM

## 2015-06-17 ENCOUNTER — Ambulatory Visit: Payer: BLUE CROSS/BLUE SHIELD

## 2015-07-04 ENCOUNTER — Ambulatory Visit
Admission: RE | Admit: 2015-07-04 | Discharge: 2015-07-04 | Disposition: A | Discharge status: Home / Self Care | Payer: BLUE CROSS/BLUE SHIELD | Source: Ambulatory Visit

## 2015-07-04 DIAGNOSIS — Z1231 Encounter for screening mammogram for malignant neoplasm of breast: Secondary | ICD-10-CM

## 2015-07-06 ENCOUNTER — Other Ambulatory Visit: Payer: Self-pay | Admitting: Internal Medicine

## 2015-07-06 DIAGNOSIS — R928 Other abnormal and inconclusive findings on diagnostic imaging of breast: Secondary | ICD-10-CM

## 2015-07-13 ENCOUNTER — Ambulatory Visit
Admission: RE | Admit: 2015-07-13 | Discharge: 2015-07-13 | Disposition: A | Discharge status: Home / Self Care | Payer: BLUE CROSS/BLUE SHIELD | Source: Ambulatory Visit | Attending: Internal Medicine | Admitting: Internal Medicine

## 2015-07-13 DIAGNOSIS — R928 Other abnormal and inconclusive findings on diagnostic imaging of breast: Secondary | ICD-10-CM

## 2016-02-14 DIAGNOSIS — Z0000 Encounter for general adult medical examination without abnormal findings: Secondary | ICD-10-CM | POA: Diagnosis not present

## 2016-02-14 DIAGNOSIS — I10 Essential (primary) hypertension: Secondary | ICD-10-CM | POA: Diagnosis not present

## 2016-02-14 DIAGNOSIS — M549 Dorsalgia, unspecified: Secondary | ICD-10-CM | POA: Diagnosis not present

## 2016-02-14 DIAGNOSIS — F419 Anxiety disorder, unspecified: Secondary | ICD-10-CM | POA: Diagnosis not present

## 2016-02-14 DIAGNOSIS — F329 Major depressive disorder, single episode, unspecified: Secondary | ICD-10-CM | POA: Diagnosis not present

## 2016-02-20 DIAGNOSIS — M189 Osteoarthritis of first carpometacarpal joint, unspecified: Secondary | ICD-10-CM | POA: Diagnosis not present

## 2016-02-20 DIAGNOSIS — M5136 Other intervertebral disc degeneration, lumbar region: Secondary | ICD-10-CM | POA: Diagnosis not present

## 2016-02-20 DIAGNOSIS — M150 Primary generalized (osteo)arthritis: Secondary | ICD-10-CM | POA: Diagnosis not present

## 2016-02-20 DIAGNOSIS — M79645 Pain in left finger(s): Secondary | ICD-10-CM | POA: Diagnosis not present

## 2016-02-20 DIAGNOSIS — M79644 Pain in right finger(s): Secondary | ICD-10-CM | POA: Diagnosis not present

## 2016-04-02 DIAGNOSIS — L814 Other melanin hyperpigmentation: Secondary | ICD-10-CM | POA: Diagnosis not present

## 2016-04-02 DIAGNOSIS — L570 Actinic keratosis: Secondary | ICD-10-CM | POA: Diagnosis not present

## 2016-04-02 DIAGNOSIS — L821 Other seborrheic keratosis: Secondary | ICD-10-CM | POA: Diagnosis not present

## 2016-04-02 DIAGNOSIS — D1801 Hemangioma of skin and subcutaneous tissue: Secondary | ICD-10-CM | POA: Diagnosis not present

## 2016-04-02 DIAGNOSIS — L219 Seborrheic dermatitis, unspecified: Secondary | ICD-10-CM | POA: Diagnosis not present

## 2016-04-02 DIAGNOSIS — C44712 Basal cell carcinoma of skin of right lower limb, including hip: Secondary | ICD-10-CM | POA: Diagnosis not present

## 2016-04-02 DIAGNOSIS — D235 Other benign neoplasm of skin of trunk: Secondary | ICD-10-CM | POA: Diagnosis not present

## 2016-04-03 DIAGNOSIS — C44712 Basal cell carcinoma of skin of right lower limb, including hip: Secondary | ICD-10-CM | POA: Diagnosis not present

## 2016-04-30 DIAGNOSIS — C44712 Basal cell carcinoma of skin of right lower limb, including hip: Secondary | ICD-10-CM | POA: Diagnosis not present

## 2016-05-21 DIAGNOSIS — M9903 Segmental and somatic dysfunction of lumbar region: Secondary | ICD-10-CM | POA: Diagnosis not present

## 2016-05-21 DIAGNOSIS — M9901 Segmental and somatic dysfunction of cervical region: Secondary | ICD-10-CM | POA: Diagnosis not present

## 2016-05-21 DIAGNOSIS — M9902 Segmental and somatic dysfunction of thoracic region: Secondary | ICD-10-CM | POA: Diagnosis not present

## 2016-05-21 DIAGNOSIS — M47812 Spondylosis without myelopathy or radiculopathy, cervical region: Secondary | ICD-10-CM | POA: Diagnosis not present

## 2016-05-22 DIAGNOSIS — M47812 Spondylosis without myelopathy or radiculopathy, cervical region: Secondary | ICD-10-CM | POA: Diagnosis not present

## 2016-05-22 DIAGNOSIS — M9901 Segmental and somatic dysfunction of cervical region: Secondary | ICD-10-CM | POA: Diagnosis not present

## 2016-05-22 DIAGNOSIS — M9903 Segmental and somatic dysfunction of lumbar region: Secondary | ICD-10-CM | POA: Diagnosis not present

## 2016-05-22 DIAGNOSIS — M9902 Segmental and somatic dysfunction of thoracic region: Secondary | ICD-10-CM | POA: Diagnosis not present

## 2016-05-23 DIAGNOSIS — M47812 Spondylosis without myelopathy or radiculopathy, cervical region: Secondary | ICD-10-CM | POA: Diagnosis not present

## 2016-05-23 DIAGNOSIS — M9903 Segmental and somatic dysfunction of lumbar region: Secondary | ICD-10-CM | POA: Diagnosis not present

## 2016-05-23 DIAGNOSIS — M9902 Segmental and somatic dysfunction of thoracic region: Secondary | ICD-10-CM | POA: Diagnosis not present

## 2016-05-23 DIAGNOSIS — M9901 Segmental and somatic dysfunction of cervical region: Secondary | ICD-10-CM | POA: Diagnosis not present

## 2016-05-24 DIAGNOSIS — M9901 Segmental and somatic dysfunction of cervical region: Secondary | ICD-10-CM | POA: Diagnosis not present

## 2016-05-24 DIAGNOSIS — M9902 Segmental and somatic dysfunction of thoracic region: Secondary | ICD-10-CM | POA: Diagnosis not present

## 2016-05-24 DIAGNOSIS — M9903 Segmental and somatic dysfunction of lumbar region: Secondary | ICD-10-CM | POA: Diagnosis not present

## 2016-05-24 DIAGNOSIS — M47812 Spondylosis without myelopathy or radiculopathy, cervical region: Secondary | ICD-10-CM | POA: Diagnosis not present

## 2016-05-29 DIAGNOSIS — M5442 Lumbago with sciatica, left side: Secondary | ICD-10-CM | POA: Diagnosis not present

## 2016-06-08 DIAGNOSIS — M5442 Lumbago with sciatica, left side: Secondary | ICD-10-CM | POA: Diagnosis not present

## 2016-06-18 DIAGNOSIS — M4807 Spinal stenosis, lumbosacral region: Secondary | ICD-10-CM | POA: Diagnosis not present

## 2016-06-21 NOTE — Progress Notes (Signed)
Please place orders in EPIC as patient is being scheduled for a pre-op appointment! Thank you! 

## 2016-07-12 DIAGNOSIS — M5126 Other intervertebral disc displacement, lumbar region: Secondary | ICD-10-CM | POA: Diagnosis not present

## 2016-07-13 ENCOUNTER — Encounter (HOSPITAL_COMMUNITY): Payer: BLUE CROSS/BLUE SHIELD

## 2016-07-13 DIAGNOSIS — Z01812 Encounter for preprocedural laboratory examination: Secondary | ICD-10-CM | POA: Diagnosis not present

## 2016-07-13 DIAGNOSIS — M5126 Other intervertebral disc displacement, lumbar region: Secondary | ICD-10-CM | POA: Diagnosis not present

## 2016-07-16 DIAGNOSIS — M5127 Other intervertebral disc displacement, lumbosacral region: Secondary | ICD-10-CM | POA: Diagnosis not present

## 2016-07-19 ENCOUNTER — Ambulatory Visit (HOSPITAL_COMMUNITY): Admission: RE | Admit: 2016-07-19 | Payer: PPO | Source: Ambulatory Visit | Admitting: Orthopedic Surgery

## 2016-07-19 ENCOUNTER — Encounter (HOSPITAL_COMMUNITY): Admission: RE | Payer: Self-pay | Source: Ambulatory Visit

## 2016-07-19 SURGERY — LUMBAR LAMINECTOMY/DECOMPRESSION MICRODISCECTOMY
Anesthesia: General

## 2016-08-07 DIAGNOSIS — Z0000 Encounter for general adult medical examination without abnormal findings: Secondary | ICD-10-CM | POA: Diagnosis not present

## 2016-08-07 DIAGNOSIS — Z780 Asymptomatic menopausal state: Secondary | ICD-10-CM | POA: Diagnosis not present

## 2016-08-07 DIAGNOSIS — I10 Essential (primary) hypertension: Secondary | ICD-10-CM | POA: Diagnosis not present

## 2016-08-07 DIAGNOSIS — N390 Urinary tract infection, site not specified: Secondary | ICD-10-CM | POA: Diagnosis not present

## 2016-09-27 DIAGNOSIS — Z0000 Encounter for general adult medical examination without abnormal findings: Secondary | ICD-10-CM | POA: Diagnosis not present

## 2016-09-27 DIAGNOSIS — F329 Major depressive disorder, single episode, unspecified: Secondary | ICD-10-CM | POA: Diagnosis not present

## 2016-09-27 DIAGNOSIS — F419 Anxiety disorder, unspecified: Secondary | ICD-10-CM | POA: Diagnosis not present

## 2016-09-27 DIAGNOSIS — E785 Hyperlipidemia, unspecified: Secondary | ICD-10-CM | POA: Diagnosis not present

## 2016-09-27 DIAGNOSIS — M549 Dorsalgia, unspecified: Secondary | ICD-10-CM | POA: Diagnosis not present

## 2016-09-27 DIAGNOSIS — I10 Essential (primary) hypertension: Secondary | ICD-10-CM | POA: Diagnosis not present

## 2016-10-03 DIAGNOSIS — M79642 Pain in left hand: Secondary | ICD-10-CM | POA: Diagnosis not present

## 2016-10-03 DIAGNOSIS — M79644 Pain in right finger(s): Secondary | ICD-10-CM | POA: Diagnosis not present

## 2016-10-03 DIAGNOSIS — M150 Primary generalized (osteo)arthritis: Secondary | ICD-10-CM | POA: Diagnosis not present

## 2016-10-03 DIAGNOSIS — M19041 Primary osteoarthritis, right hand: Secondary | ICD-10-CM | POA: Diagnosis not present

## 2016-10-03 DIAGNOSIS — M19042 Primary osteoarthritis, left hand: Secondary | ICD-10-CM | POA: Diagnosis not present

## 2016-10-03 DIAGNOSIS — M79643 Pain in unspecified hand: Secondary | ICD-10-CM | POA: Diagnosis not present

## 2016-10-03 DIAGNOSIS — M5136 Other intervertebral disc degeneration, lumbar region: Secondary | ICD-10-CM | POA: Diagnosis not present

## 2016-10-03 DIAGNOSIS — M79645 Pain in left finger(s): Secondary | ICD-10-CM | POA: Diagnosis not present

## 2016-10-03 DIAGNOSIS — M189 Osteoarthritis of first carpometacarpal joint, unspecified: Secondary | ICD-10-CM | POA: Diagnosis not present

## 2016-10-03 DIAGNOSIS — M79641 Pain in right hand: Secondary | ICD-10-CM | POA: Diagnosis not present

## 2016-11-01 DIAGNOSIS — M5126 Other intervertebral disc displacement, lumbar region: Secondary | ICD-10-CM | POA: Diagnosis not present

## 2016-11-01 DIAGNOSIS — R030 Elevated blood-pressure reading, without diagnosis of hypertension: Secondary | ICD-10-CM | POA: Diagnosis not present

## 2016-11-01 DIAGNOSIS — Z6836 Body mass index (BMI) 36.0-36.9, adult: Secondary | ICD-10-CM | POA: Diagnosis not present

## 2016-11-15 ENCOUNTER — Other Ambulatory Visit: Payer: Self-pay | Admitting: Neurosurgery

## 2016-11-15 DIAGNOSIS — M5126 Other intervertebral disc displacement, lumbar region: Secondary | ICD-10-CM

## 2016-11-28 ENCOUNTER — Ambulatory Visit
Admission: RE | Admit: 2016-11-28 | Discharge: 2016-11-28 | Disposition: A | Discharge status: Home / Self Care | Payer: PPO | Source: Ambulatory Visit | Attending: Neurosurgery | Admitting: Neurosurgery

## 2016-11-28 DIAGNOSIS — M48061 Spinal stenosis, lumbar region without neurogenic claudication: Secondary | ICD-10-CM | POA: Diagnosis not present

## 2016-11-28 DIAGNOSIS — M5126 Other intervertebral disc displacement, lumbar region: Secondary | ICD-10-CM

## 2016-11-28 MED ORDER — GADOBENATE DIMEGLUMINE 529 MG/ML IV SOLN: 20.0000 mL | Freq: Once | INTRAVENOUS | Status: DC | PRN

## 2016-12-05 DIAGNOSIS — R030 Elevated blood-pressure reading, without diagnosis of hypertension: Secondary | ICD-10-CM | POA: Diagnosis not present

## 2016-12-05 DIAGNOSIS — Z6836 Body mass index (BMI) 36.0-36.9, adult: Secondary | ICD-10-CM | POA: Diagnosis not present

## 2016-12-05 DIAGNOSIS — M5126 Other intervertebral disc displacement, lumbar region: Secondary | ICD-10-CM | POA: Diagnosis not present

## 2016-12-26 DIAGNOSIS — M79644 Pain in right finger(s): Secondary | ICD-10-CM | POA: Diagnosis not present

## 2016-12-26 DIAGNOSIS — M79645 Pain in left finger(s): Secondary | ICD-10-CM | POA: Diagnosis not present

## 2016-12-26 DIAGNOSIS — M79673 Pain in unspecified foot: Secondary | ICD-10-CM | POA: Diagnosis not present

## 2016-12-26 DIAGNOSIS — M79643 Pain in unspecified hand: Secondary | ICD-10-CM | POA: Diagnosis not present

## 2016-12-26 DIAGNOSIS — M150 Primary generalized (osteo)arthritis: Secondary | ICD-10-CM | POA: Diagnosis not present

## 2016-12-26 DIAGNOSIS — M19072 Primary osteoarthritis, left ankle and foot: Secondary | ICD-10-CM | POA: Diagnosis not present

## 2016-12-26 DIAGNOSIS — M189 Osteoarthritis of first carpometacarpal joint, unspecified: Secondary | ICD-10-CM | POA: Diagnosis not present

## 2016-12-26 DIAGNOSIS — M5136 Other intervertebral disc degeneration, lumbar region: Secondary | ICD-10-CM | POA: Diagnosis not present

## 2017-01-10 ENCOUNTER — Other Ambulatory Visit: Payer: Self-pay | Admitting: Podiatry

## 2017-01-10 ENCOUNTER — Ambulatory Visit: Payer: PPO | Admitting: Podiatry

## 2017-01-10 ENCOUNTER — Ambulatory Visit (INDEPENDENT_AMBULATORY_CARE_PROVIDER_SITE_OTHER): Payer: PPO

## 2017-01-10 ENCOUNTER — Encounter: Payer: Self-pay | Admitting: Podiatry

## 2017-01-10 DIAGNOSIS — M79671 Pain in right foot: Secondary | ICD-10-CM

## 2017-01-10 DIAGNOSIS — M722 Plantar fascial fibromatosis: Secondary | ICD-10-CM

## 2017-01-10 MED ORDER — TRIAMCINOLONE ACETONIDE 10 MG/ML IJ SUSP: 10.0000 mg | Freq: Once | INTRAMUSCULAR | Status: AC

## 2017-01-10 NOTE — Patient Instructions (Signed)

## 2017-01-10 NOTE — Progress Notes (Signed)
   Subjective:    Patient ID: Chloe Odom, female    DOB: 07/11/1950, 66 y.o.   MRN: [id number]  HPI    Review of Systems  All other systems reviewed and are negative.      Objective:   Physical Exam        Assessment & Plan:

## 2017-01-10 NOTE — Progress Notes (Signed)
Subjective:   Patient ID: Chloe Odom, female   DOB: 66 y.o.   MRN: [id number]   HPI Patient presents with 1 year history of pain in the bottom of the right heel.  Patient states the pain is quite intense and she has tried at this time Voltaren gel icing heat and has been referred by her family physician.  She also states her arch is partially collapsed and that part of her problem.   Review of Systems  All other systems reviewed and are negative.       Objective:  Physical Exam  Constitutional: She appears well-developed and well-nourished.  Cardiovascular: Intact distal pulses.  Pulmonary/Chest: Effort normal.  Musculoskeletal: Normal range of motion.  Neurological: She is alert.  Skin: Skin is warm.  Nursing note and vitals reviewed.   Neurovascular status intact muscle strength was adequate with range of motion found to be within normal limits.  Patient is noted to have collapsed medial longitudinal arch right over left with inflammation and fluid around the medial band and has had significant back surgery also this year.  Patient does not smoke and likes to be active but cannot due to the pain that she is experiencing her heel and patient is noted to have good digital perfusion and is well oriented x3     Assessment:  Inflammatory fasciitis right with collapse of medial longitudinal arch as part of the pathological process that is present     Plan:  H&P and x-rays reviewed with patient.  At this time I went ahead and injected the plantar fascia right 3 mg Kenalog 5 mg lidocaine and I applied fascial brace and the arch with instructions on usage as I think this is an important part of her recovery.  Patient will be seen back for [place] to recheck again in the next several weeks or earlier if any issues should occur and she is encouraged to call with any questions.  Patient x-rays indicate large spur formation plantar heel with depression of the arch noted and no indication of  calcaneal stress fracture.

## 2017-01-24 ENCOUNTER — Encounter: Payer: Self-pay | Admitting: Podiatry

## 2017-01-24 ENCOUNTER — Ambulatory Visit: Payer: PPO | Admitting: Podiatry

## 2017-01-24 DIAGNOSIS — M722 Plantar fascial fibromatosis: Secondary | ICD-10-CM | POA: Diagnosis not present

## 2017-01-24 DIAGNOSIS — M79671 Pain in right foot: Secondary | ICD-10-CM

## 2017-01-25 ENCOUNTER — Telehealth: Payer: Self-pay | Admitting: Podiatry

## 2017-01-25 NOTE — Progress Notes (Signed)
Subjective:   Patient ID: Chloe Odom, female   DOB: 66 y.o.   MRN: [id number]   HPI Patient presents stating the heel has improved but she has had a long-term history with this and she would like to be more active.   ROS      Objective:  Physical Exam  Neurovascular status intact with patient found to have discomfort right plantar fascia at the insertional point of the calcaneus with no other pathology noted except for flatfoot deformity     Assessment:  Plantar fasciitis with inflammation with moderate depression of the arch     Plan:  Reviewed condition and discussed physical therapy and supportive shoes and I did go ahead today scanning for customized orthotics to reduce plantar pressure on the feet.  Patient will be seen back for [place] to recheck

## 2017-01-25 NOTE — Telephone Encounter (Signed)
Called pt regarding orthotic coverage and [employer] does not cover orthotics. Pt stated she could not afford them..  I did give her the option of 300.00 paying half down and make a payment plan.She is thinking about it and will let us know.

## 2017-03-25 DIAGNOSIS — E785 Hyperlipidemia, unspecified: Secondary | ICD-10-CM | POA: Diagnosis not present

## 2017-04-04 DIAGNOSIS — Z5181 Encounter for therapeutic drug level monitoring: Secondary | ICD-10-CM | POA: Diagnosis not present

## 2017-04-04 DIAGNOSIS — M549 Dorsalgia, unspecified: Secondary | ICD-10-CM | POA: Diagnosis not present

## 2017-04-04 DIAGNOSIS — F419 Anxiety disorder, unspecified: Secondary | ICD-10-CM | POA: Diagnosis not present

## 2017-04-04 DIAGNOSIS — R739 Hyperglycemia, unspecified: Secondary | ICD-10-CM | POA: Diagnosis not present

## 2017-04-04 DIAGNOSIS — I10 Essential (primary) hypertension: Secondary | ICD-10-CM | POA: Diagnosis not present

## 2017-04-05 DIAGNOSIS — L820 Inflamed seborrheic keratosis: Secondary | ICD-10-CM | POA: Diagnosis not present

## 2017-04-05 DIAGNOSIS — L570 Actinic keratosis: Secondary | ICD-10-CM | POA: Diagnosis not present

## 2017-04-05 DIAGNOSIS — L814 Other melanin hyperpigmentation: Secondary | ICD-10-CM | POA: Diagnosis not present

## 2017-04-05 DIAGNOSIS — D225 Melanocytic nevi of trunk: Secondary | ICD-10-CM | POA: Diagnosis not present

## 2017-04-05 DIAGNOSIS — Z85828 Personal history of other malignant neoplasm of skin: Secondary | ICD-10-CM | POA: Diagnosis not present

## 2017-04-05 DIAGNOSIS — L218 Other seborrheic dermatitis: Secondary | ICD-10-CM | POA: Diagnosis not present

## 2017-04-05 DIAGNOSIS — L821 Other seborrheic keratosis: Secondary | ICD-10-CM | POA: Diagnosis not present

## 2017-05-13 DIAGNOSIS — M549 Dorsalgia, unspecified: Secondary | ICD-10-CM | POA: Diagnosis not present

## 2017-05-13 DIAGNOSIS — E785 Hyperlipidemia, unspecified: Secondary | ICD-10-CM | POA: Diagnosis not present

## 2017-05-13 DIAGNOSIS — R739 Hyperglycemia, unspecified: Secondary | ICD-10-CM | POA: Diagnosis not present

## 2017-05-13 DIAGNOSIS — Z79899 Other long term (current) drug therapy: Secondary | ICD-10-CM | POA: Diagnosis not present

## 2017-05-13 DIAGNOSIS — F419 Anxiety disorder, unspecified: Secondary | ICD-10-CM | POA: Diagnosis not present

## 2017-05-13 DIAGNOSIS — I10 Essential (primary) hypertension: Secondary | ICD-10-CM | POA: Diagnosis not present

## 2017-08-09 DIAGNOSIS — Z5181 Encounter for therapeutic drug level monitoring: Secondary | ICD-10-CM | POA: Diagnosis not present

## 2017-08-09 DIAGNOSIS — M549 Dorsalgia, unspecified: Secondary | ICD-10-CM | POA: Diagnosis not present

## 2017-08-09 DIAGNOSIS — E785 Hyperlipidemia, unspecified: Secondary | ICD-10-CM | POA: Diagnosis not present

## 2017-08-09 DIAGNOSIS — I10 Essential (primary) hypertension: Secondary | ICD-10-CM | POA: Diagnosis not present

## 2017-10-09 DIAGNOSIS — F1190 Opioid use, unspecified, uncomplicated: Secondary | ICD-10-CM | POA: Diagnosis not present

## 2017-10-17 DIAGNOSIS — M79645 Pain in left finger(s): Secondary | ICD-10-CM | POA: Diagnosis not present

## 2017-10-17 DIAGNOSIS — M19042 Primary osteoarthritis, left hand: Secondary | ICD-10-CM | POA: Diagnosis not present

## 2017-10-17 DIAGNOSIS — M79643 Pain in unspecified hand: Secondary | ICD-10-CM | POA: Diagnosis not present

## 2017-10-17 DIAGNOSIS — L409 Psoriasis, unspecified: Secondary | ICD-10-CM | POA: Diagnosis not present

## 2017-10-17 DIAGNOSIS — M79642 Pain in left hand: Secondary | ICD-10-CM | POA: Diagnosis not present

## 2017-10-17 DIAGNOSIS — M189 Osteoarthritis of first carpometacarpal joint, unspecified: Secondary | ICD-10-CM | POA: Diagnosis not present

## 2017-10-17 DIAGNOSIS — M25579 Pain in unspecified ankle and joints of unspecified foot: Secondary | ICD-10-CM | POA: Diagnosis not present

## 2017-10-17 DIAGNOSIS — M150 Primary generalized (osteo)arthritis: Secondary | ICD-10-CM | POA: Diagnosis not present

## 2017-10-17 DIAGNOSIS — M79641 Pain in right hand: Secondary | ICD-10-CM | POA: Diagnosis not present

## 2017-10-17 DIAGNOSIS — M19041 Primary osteoarthritis, right hand: Secondary | ICD-10-CM | POA: Diagnosis not present

## 2017-10-17 DIAGNOSIS — M79644 Pain in right finger(s): Secondary | ICD-10-CM | POA: Diagnosis not present

## 2017-10-17 DIAGNOSIS — M5136 Other intervertebral disc degeneration, lumbar region: Secondary | ICD-10-CM | POA: Diagnosis not present

## 2017-10-29 DIAGNOSIS — M189 Osteoarthritis of first carpometacarpal joint, unspecified: Secondary | ICD-10-CM | POA: Diagnosis not present

## 2017-10-29 DIAGNOSIS — R7982 Elevated C-reactive protein (CRP): Secondary | ICD-10-CM | POA: Diagnosis not present

## 2017-10-29 DIAGNOSIS — M150 Primary generalized (osteo)arthritis: Secondary | ICD-10-CM | POA: Diagnosis not present

## 2017-10-29 DIAGNOSIS — M25579 Pain in unspecified ankle and joints of unspecified foot: Secondary | ICD-10-CM | POA: Diagnosis not present

## 2017-10-29 DIAGNOSIS — M5136 Other intervertebral disc degeneration, lumbar region: Secondary | ICD-10-CM | POA: Diagnosis not present

## 2017-10-29 DIAGNOSIS — M79645 Pain in left finger(s): Secondary | ICD-10-CM | POA: Diagnosis not present

## 2017-10-29 DIAGNOSIS — L409 Psoriasis, unspecified: Secondary | ICD-10-CM | POA: Diagnosis not present

## 2017-10-29 DIAGNOSIS — M1990 Unspecified osteoarthritis, unspecified site: Secondary | ICD-10-CM | POA: Diagnosis not present

## 2017-10-29 DIAGNOSIS — Z79899 Other long term (current) drug therapy: Secondary | ICD-10-CM | POA: Diagnosis not present

## 2017-10-29 DIAGNOSIS — M79644 Pain in right finger(s): Secondary | ICD-10-CM | POA: Diagnosis not present

## 2017-10-29 DIAGNOSIS — M79643 Pain in unspecified hand: Secondary | ICD-10-CM | POA: Diagnosis not present

## 2017-11-06 DIAGNOSIS — Z79899 Other long term (current) drug therapy: Secondary | ICD-10-CM | POA: Diagnosis not present

## 2017-11-06 DIAGNOSIS — M549 Dorsalgia, unspecified: Secondary | ICD-10-CM | POA: Diagnosis not present

## 2017-12-12 DIAGNOSIS — M79645 Pain in left finger(s): Secondary | ICD-10-CM | POA: Diagnosis not present

## 2017-12-12 DIAGNOSIS — M5136 Other intervertebral disc degeneration, lumbar region: Secondary | ICD-10-CM | POA: Diagnosis not present

## 2017-12-12 DIAGNOSIS — L409 Psoriasis, unspecified: Secondary | ICD-10-CM | POA: Diagnosis not present

## 2017-12-12 DIAGNOSIS — Z79899 Other long term (current) drug therapy: Secondary | ICD-10-CM | POA: Diagnosis not present

## 2017-12-12 DIAGNOSIS — M79644 Pain in right finger(s): Secondary | ICD-10-CM | POA: Diagnosis not present

## 2017-12-12 DIAGNOSIS — M25579 Pain in unspecified ankle and joints of unspecified foot: Secondary | ICD-10-CM | POA: Diagnosis not present

## 2017-12-12 DIAGNOSIS — M79643 Pain in unspecified hand: Secondary | ICD-10-CM | POA: Diagnosis not present

## 2017-12-12 DIAGNOSIS — M189 Osteoarthritis of first carpometacarpal joint, unspecified: Secondary | ICD-10-CM | POA: Diagnosis not present

## 2017-12-12 DIAGNOSIS — M150 Primary generalized (osteo)arthritis: Secondary | ICD-10-CM | POA: Diagnosis not present

## 2018-01-14 DIAGNOSIS — J3489 Other specified disorders of nose and nasal sinuses: Secondary | ICD-10-CM | POA: Diagnosis not present

## 2018-01-14 DIAGNOSIS — M549 Dorsalgia, unspecified: Secondary | ICD-10-CM | POA: Diagnosis not present

## 2018-01-14 DIAGNOSIS — L989 Disorder of the skin and subcutaneous tissue, unspecified: Secondary | ICD-10-CM | POA: Diagnosis not present

## 2018-01-14 DIAGNOSIS — F419 Anxiety disorder, unspecified: Secondary | ICD-10-CM | POA: Diagnosis not present

## 2018-01-14 DIAGNOSIS — I10 Essential (primary) hypertension: Secondary | ICD-10-CM | POA: Diagnosis not present

## 2018-01-14 DIAGNOSIS — F329 Major depressive disorder, single episode, unspecified: Secondary | ICD-10-CM | POA: Diagnosis not present

## 2018-01-31 DIAGNOSIS — R05 Cough: Secondary | ICD-10-CM | POA: Diagnosis not present

## 2018-01-31 DIAGNOSIS — I10 Essential (primary) hypertension: Secondary | ICD-10-CM | POA: Diagnosis not present

## 2018-01-31 DIAGNOSIS — R110 Nausea: Secondary | ICD-10-CM | POA: Diagnosis not present

## 2018-01-31 DIAGNOSIS — J0100 Acute maxillary sinusitis, unspecified: Secondary | ICD-10-CM | POA: Diagnosis not present

## 2018-03-21 ENCOUNTER — Encounter: Payer: Self-pay | Admitting: *Deleted

## 2018-04-10 DIAGNOSIS — R739 Hyperglycemia, unspecified: Secondary | ICD-10-CM | POA: Diagnosis not present

## 2018-04-10 DIAGNOSIS — E785 Hyperlipidemia, unspecified: Secondary | ICD-10-CM | POA: Diagnosis not present

## 2018-04-10 DIAGNOSIS — N390 Urinary tract infection, site not specified: Secondary | ICD-10-CM | POA: Diagnosis not present

## 2018-04-10 DIAGNOSIS — E559 Vitamin D deficiency, unspecified: Secondary | ICD-10-CM | POA: Diagnosis not present

## 2018-04-10 DIAGNOSIS — I10 Essential (primary) hypertension: Secondary | ICD-10-CM | POA: Diagnosis not present

## 2018-04-10 DIAGNOSIS — Z79899 Other long term (current) drug therapy: Secondary | ICD-10-CM | POA: Diagnosis not present

## 2018-04-17 DIAGNOSIS — K7689 Other specified diseases of liver: Secondary | ICD-10-CM | POA: Diagnosis not present

## 2018-04-17 DIAGNOSIS — M25559 Pain in unspecified hip: Secondary | ICD-10-CM | POA: Diagnosis not present

## 2018-04-17 DIAGNOSIS — I10 Essential (primary) hypertension: Secondary | ICD-10-CM | POA: Diagnosis not present

## 2018-04-17 DIAGNOSIS — Z0000 Encounter for general adult medical examination without abnormal findings: Secondary | ICD-10-CM | POA: Diagnosis not present

## 2018-04-17 DIAGNOSIS — F419 Anxiety disorder, unspecified: Secondary | ICD-10-CM | POA: Diagnosis not present

## 2018-04-17 DIAGNOSIS — M549 Dorsalgia, unspecified: Secondary | ICD-10-CM | POA: Diagnosis not present

## 2018-04-17 DIAGNOSIS — F329 Major depressive disorder, single episode, unspecified: Secondary | ICD-10-CM | POA: Diagnosis not present

## 2018-04-17 DIAGNOSIS — R739 Hyperglycemia, unspecified: Secondary | ICD-10-CM | POA: Diagnosis not present

## 2018-04-17 DIAGNOSIS — E785 Hyperlipidemia, unspecified: Secondary | ICD-10-CM | POA: Diagnosis not present

## 2018-04-27 ENCOUNTER — Encounter: Payer: Self-pay | Admitting: Internal Medicine

## 2018-05-07 DIAGNOSIS — M189 Osteoarthritis of first carpometacarpal joint, unspecified: Secondary | ICD-10-CM | POA: Diagnosis not present

## 2018-05-07 DIAGNOSIS — Z79899 Other long term (current) drug therapy: Secondary | ICD-10-CM | POA: Diagnosis not present

## 2018-05-07 DIAGNOSIS — M5136 Other intervertebral disc degeneration, lumbar region: Secondary | ICD-10-CM | POA: Diagnosis not present

## 2018-05-07 DIAGNOSIS — M79644 Pain in right finger(s): Secondary | ICD-10-CM | POA: Diagnosis not present

## 2018-05-07 DIAGNOSIS — M150 Primary generalized (osteo)arthritis: Secondary | ICD-10-CM | POA: Diagnosis not present

## 2018-05-07 DIAGNOSIS — L4050 Arthropathic psoriasis, unspecified: Secondary | ICD-10-CM | POA: Diagnosis not present

## 2018-05-07 DIAGNOSIS — L409 Psoriasis, unspecified: Secondary | ICD-10-CM | POA: Diagnosis not present

## 2018-05-07 DIAGNOSIS — M79643 Pain in unspecified hand: Secondary | ICD-10-CM | POA: Diagnosis not present

## 2018-05-07 DIAGNOSIS — M79645 Pain in left finger(s): Secondary | ICD-10-CM | POA: Diagnosis not present

## 2018-05-28 DIAGNOSIS — M150 Primary generalized (osteo)arthritis: Secondary | ICD-10-CM | POA: Diagnosis not present

## 2018-05-28 DIAGNOSIS — M79645 Pain in left finger(s): Secondary | ICD-10-CM | POA: Diagnosis not present

## 2018-05-28 DIAGNOSIS — M5136 Other intervertebral disc degeneration, lumbar region: Secondary | ICD-10-CM | POA: Diagnosis not present

## 2018-05-28 DIAGNOSIS — M79643 Pain in unspecified hand: Secondary | ICD-10-CM | POA: Diagnosis not present

## 2018-05-28 DIAGNOSIS — M79644 Pain in right finger(s): Secondary | ICD-10-CM | POA: Diagnosis not present

## 2018-05-28 DIAGNOSIS — Z79899 Other long term (current) drug therapy: Secondary | ICD-10-CM | POA: Diagnosis not present

## 2018-05-28 DIAGNOSIS — L4050 Arthropathic psoriasis, unspecified: Secondary | ICD-10-CM | POA: Diagnosis not present

## 2018-05-28 DIAGNOSIS — M189 Osteoarthritis of first carpometacarpal joint, unspecified: Secondary | ICD-10-CM | POA: Diagnosis not present

## 2018-05-28 DIAGNOSIS — L409 Psoriasis, unspecified: Secondary | ICD-10-CM | POA: Diagnosis not present

## 2018-06-25 DIAGNOSIS — L409 Psoriasis, unspecified: Secondary | ICD-10-CM | POA: Diagnosis not present

## 2018-07-16 ENCOUNTER — Ambulatory Visit: Payer: Medicare HMO

## 2018-07-16 ENCOUNTER — Other Ambulatory Visit: Payer: Self-pay

## 2018-07-16 VITALS — Ht 65.0 in | Wt 205.0 lb

## 2018-07-16 DIAGNOSIS — Z86010 Personal history of colonic polyps: Secondary | ICD-10-CM

## 2018-07-16 NOTE — Progress Notes (Signed)
No egg or soy allergy known to patient  No issues with past sedation with any surgeries  or procedures, no intubation problems  No diet pills per patient No home 02 use per patient  No blood thinners per patient  Pt denies issues with constipation  No A fib or A flutter  EMMI video sent to pt's e mail  

## 2018-07-25 DIAGNOSIS — M549 Dorsalgia, unspecified: Secondary | ICD-10-CM | POA: Diagnosis not present

## 2018-07-25 DIAGNOSIS — Z79899 Other long term (current) drug therapy: Secondary | ICD-10-CM | POA: Diagnosis not present

## 2018-07-25 DIAGNOSIS — F419 Anxiety disorder, unspecified: Secondary | ICD-10-CM | POA: Diagnosis not present

## 2018-07-25 DIAGNOSIS — Z8639 Personal history of other endocrine, nutritional and metabolic disease: Secondary | ICD-10-CM | POA: Diagnosis not present

## 2018-07-25 DIAGNOSIS — Z0000 Encounter for general adult medical examination without abnormal findings: Secondary | ICD-10-CM | POA: Diagnosis not present

## 2018-07-30 ENCOUNTER — Telehealth: Payer: Self-pay | Admitting: Internal Medicine

## 2018-07-30 NOTE — Telephone Encounter (Signed)

## 2018-07-31 ENCOUNTER — Other Ambulatory Visit: Payer: Self-pay

## 2018-07-31 ENCOUNTER — Encounter: Payer: Self-pay | Admitting: Internal Medicine

## 2018-07-31 ENCOUNTER — Ambulatory Visit (AMBULATORY_SURGERY_CENTER): Payer: Medicare HMO | Admitting: Internal Medicine

## 2018-07-31 VITALS — BP 133/64 | HR 51 | Temp 98.8°F | Resp 11 | Ht 64.0 in | Wt 220.0 lb

## 2018-07-31 DIAGNOSIS — Z860100 Personal history of colon polyps, unspecified: Secondary | ICD-10-CM

## 2018-07-31 DIAGNOSIS — D122 Benign neoplasm of ascending colon: Secondary | ICD-10-CM | POA: Diagnosis not present

## 2018-07-31 DIAGNOSIS — D125 Benign neoplasm of sigmoid colon: Secondary | ICD-10-CM | POA: Diagnosis not present

## 2018-07-31 DIAGNOSIS — D120 Benign neoplasm of cecum: Secondary | ICD-10-CM

## 2018-07-31 DIAGNOSIS — K635 Polyp of colon: Secondary | ICD-10-CM

## 2018-07-31 MED ORDER — SODIUM CHLORIDE 0.9 % IV SOLN: 500.0000 mL | Freq: Once | INTRAVENOUS | Status: AC

## 2018-07-31 NOTE — Patient Instructions (Signed)
Impression/Recommendations:  Polyp handout given to patient. Diverticulosis handout given to patient. Hemorrhoid handout given to patient.  Resume previous diet. Continue present medications.  Await pathology results.  Repeat colonoscopy recommended for surveillance.  Date to be determined after pathology results reviewed.  YOU HAD AN ENDOSCOPIC PROCEDURE TODAY AT [facility]:   Refer to the procedure report that was given to you for any specific questions about what was found during the examination.  If the procedure report does not answer your questions, please call your gastroenterologist to clarify.  If you requested that your care partner not be given the details of your procedure findings, then the procedure report has been included in a sealed envelope for you to review at your convenience later.  YOU SHOULD EXPECT: Some feelings of bloating in the abdomen. Passage of more gas than usual.  Walking can help get rid of the air that was put into your GI tract during the procedure and reduce the bloating. If you had a lower endoscopy (such as a colonoscopy or flexible sigmoidoscopy) you may notice spotting of blood in your stool or on the toilet paper. If you underwent a bowel prep for your procedure, you may not have a normal bowel movement for a few days.  Please Note:  You might notice some irritation and congestion in your nose or some drainage.  This is from the oxygen used during your procedure.  There is no need for concern and it should clear up in a day or so.  SYMPTOMS TO REPORT IMMEDIATELY:   Following lower endoscopy (colonoscopy or flexible sigmoidoscopy):  Excessive amounts of blood in the stool  Significant tenderness or worsening of abdominal pains  Swelling of the abdomen that is new, acute  Fever of 100F or higher  For urgent or emergent issues, a gastroenterologist can be reached at any hour by calling [phone].   DIET:  We do recommend a  small meal at first, but then you may proceed to your regular diet.  Drink plenty of fluids but you should avoid alcoholic beverages for 24 hours.  ACTIVITY:  You should plan to take it easy for the rest of today and you should NOT DRIVE or use heavy machinery until tomorrow (because of the sedation medicines used during the test).    FOLLOW UP: Our staff will call the number listed on your records 48-72 hours following your procedure to check on you and address any questions or concerns that you may have regarding the information given to you following your procedure. If we do not reach you, we will leave a message.  We will attempt to reach you two times.  During this call, we will ask if you have developed any symptoms of COVID 19. If you develop any symptoms (ie: fever, flu-like symptoms, shortness of breath, cough etc.) before then, please call [phone].  If you test positive for Covid 19 in the 2 weeks post procedure, please call and report this information to [place].    If any biopsies were taken you will be contacted by phone or by letter within the next 1-3 weeks.  Please call us at [phone] if you have not heard about the biopsies in 3 weeks.    SIGNATURES/CONFIDENTIALITY: You and/or your care partner have signed paperwork which will be entered into your electronic medical record.  These signatures attest to the fact that that the information above on your After Visit Summary has been reviewed and is understood.  Full responsibility of the confidentiality of this discharge information lies with you and/or your care-partner.

## 2018-07-31 NOTE — Op Note (Signed)
[facility] Patient Name: Chloe Odom Procedure Date: 07/31/2018 3:05 PM MRN: [id number] Endoscopist: Jerene Bears , MD Age: 68 Referring MD:  Date of Birth: 1950/10/08 Gender: Female Account #: [id number] Procedure:                Colonoscopy Indications:              Surveillance: Personal history of adenomatous                            polyps on last colonoscopy 3 years ago Medicines:                Monitored Anesthesia Care Procedure:                Pre-Anesthesia Assessment:                           - Prior to the procedure, a History and Physical                            was performed, and patient medications and                            allergies were reviewed. The patient's tolerance of                            previous anesthesia was also reviewed. The risks                            and benefits of the procedure and the sedation                            options and risks were discussed with the patient.                            All questions were answered, and informed consent                            was obtained. Prior Anticoagulants: The patient has                            taken no previous anticoagulant or antiplatelet                            agents. ASA Grade Assessment: II - A patient with                            mild systemic disease. After reviewing the risks                            and benefits, the patient was deemed in                            satisfactory condition to undergo the procedure.  After obtaining informed consent, the colonoscope                            was passed under direct vision. Throughout the                            procedure, the patient's blood pressure, pulse, and                            oxygen saturations were monitored continuously. The                            Colonoscope was introduced through the anus and                            advanced to the cecum,  identified by appendiceal                            orifice and ileocecal valve. The colonoscopy was                            performed without difficulty. The patient tolerated                            the procedure well. The quality of the bowel                            preparation was good. The ileocecal valve,                            appendiceal orifice, and rectum were photographed. Scope In: 3:16:50 PM Scope Out: 3:37:55 PM Scope Withdrawal Time: 0 hours 17 minutes 2 seconds  Total Procedure Duration: 0 hours 21 minutes 5 seconds  Findings:                 The digital rectal exam was normal.                           A 3 mm polyp was found in the cecum. The polyp was                            sessile. The polyp was removed with a cold snare.                            Resection and retrieval were complete.                           Three sessile polyps were found in the ascending                            colon. The polyps were 3 to 6 mm in size. These  polyps were removed with a cold snare. Resection                            and retrieval were complete.                           A 4 mm polyp was found in the sigmoid colon. The                            polyp was sessile. The polyp was removed with a                            cold snare. Resection and retrieval were complete.                           Multiple small and large-mouthed diverticula were                            found in the sigmoid colon and descending colon.                           Internal hemorrhoids were found during                            retroflexion. The hemorrhoids were small. Complications:            No immediate complications. Estimated Blood Loss:     Estimated blood loss was minimal. Impression:               - One 3 mm polyp in the cecum, removed with a cold                            snare. Resected and retrieved.                           - Three 3 to 6  mm polyps in the ascending colon,                            removed with a cold snare. Resected and retrieved.                           - One 4 mm polyp in the sigmoid colon, removed with                            a cold snare. Resected and retrieved.                           - Moderate diverticulosis in the sigmoid colon and                            in the descending colon.                           - Internal hemorrhoids. Recommendation:           -  Patient has a contact number available for                            emergencies. The signs and symptoms of potential                            delayed complications were discussed with the                            patient. Return to normal activities tomorrow.                            Written discharge instructions were provided to the                            patient.                           - Resume previous diet.                           - Continue present medications.                           - Await pathology results.                           - Repeat colonoscopy is recommended for                            surveillance. The colonoscopy date will be                            determined after pathology results from today's                            exam become available for review. Jerene Bears, MD 07/31/2018 3:42:41 PM This report has been signed electronically.

## 2018-07-31 NOTE — Progress Notes (Signed)
PT taken to PACU. Monitors in place. VSS. Report given to RN. 

## 2018-07-31 NOTE — Progress Notes (Signed)
Called to room to assist during endoscopic procedure.  Patient ID and intended procedure confirmed with present staff. Received instructions for my participation in the procedure from the performing physician.  

## 2018-08-04 ENCOUNTER — Telehealth: Payer: Self-pay

## 2018-08-04 NOTE — Telephone Encounter (Signed)
  Follow up Call-  Call back number 07/31/2018  Post procedure Call Back phone  # [phone]  Permission to leave phone message Yes  Some recent data might be hidden     Patient questions:  Do you have a fever, pain , or abdominal swelling? No. Pain Score  0 *  Have you tolerated food without any problems? Yes.    Have you been able to return to your normal activities? Yes.    Do you have any questions about your discharge instructions: Diet   No. Medications  No. Follow up visit  No.  Do you have questions or concerns about your Care? No.  Actions: * If pain score is 4 or above: No action needed, pain <4.  1. Have you developed a fever since your procedure? no  2.   Have you had an respiratory symptoms (SOB or cough) since your procedure? no  3.   Have you tested positive for COVID 19 since your procedure no  4.   Have you had any family members/close contacts diagnosed with the COVID 19 since your procedure?  no   If yes to any of these questions please route to Joylene John, RN and Alphonsa Gin, Therapist, sports.

## 2018-08-05 ENCOUNTER — Encounter: Payer: Self-pay | Admitting: Internal Medicine

## 2018-09-18 DIAGNOSIS — J209 Acute bronchitis, unspecified: Secondary | ICD-10-CM | POA: Diagnosis not present

## 2018-10-02 DIAGNOSIS — M79645 Pain in left finger(s): Secondary | ICD-10-CM | POA: Diagnosis not present

## 2018-10-02 DIAGNOSIS — L4050 Arthropathic psoriasis, unspecified: Secondary | ICD-10-CM | POA: Diagnosis not present

## 2018-10-02 DIAGNOSIS — Z79899 Other long term (current) drug therapy: Secondary | ICD-10-CM | POA: Diagnosis not present

## 2018-10-02 DIAGNOSIS — M150 Primary generalized (osteo)arthritis: Secondary | ICD-10-CM | POA: Diagnosis not present

## 2018-10-02 DIAGNOSIS — M5136 Other intervertebral disc degeneration, lumbar region: Secondary | ICD-10-CM | POA: Diagnosis not present

## 2018-10-02 DIAGNOSIS — M79644 Pain in right finger(s): Secondary | ICD-10-CM | POA: Diagnosis not present

## 2018-10-02 DIAGNOSIS — M79643 Pain in unspecified hand: Secondary | ICD-10-CM | POA: Diagnosis not present

## 2018-10-02 DIAGNOSIS — M189 Osteoarthritis of first carpometacarpal joint, unspecified: Secondary | ICD-10-CM | POA: Diagnosis not present

## 2018-10-02 DIAGNOSIS — L409 Psoriasis, unspecified: Secondary | ICD-10-CM | POA: Diagnosis not present

## 2018-10-22 DIAGNOSIS — Z5181 Encounter for therapeutic drug level monitoring: Secondary | ICD-10-CM | POA: Diagnosis not present

## 2018-10-22 DIAGNOSIS — E559 Vitamin D deficiency, unspecified: Secondary | ICD-10-CM | POA: Diagnosis not present

## 2018-10-22 DIAGNOSIS — E785 Hyperlipidemia, unspecified: Secondary | ICD-10-CM | POA: Diagnosis not present

## 2018-10-22 DIAGNOSIS — F419 Anxiety disorder, unspecified: Secondary | ICD-10-CM | POA: Diagnosis not present

## 2018-10-22 DIAGNOSIS — N390 Urinary tract infection, site not specified: Secondary | ICD-10-CM | POA: Diagnosis not present

## 2018-10-22 DIAGNOSIS — I10 Essential (primary) hypertension: Secondary | ICD-10-CM | POA: Diagnosis not present

## 2018-10-22 DIAGNOSIS — Z0000 Encounter for general adult medical examination without abnormal findings: Secondary | ICD-10-CM | POA: Diagnosis not present

## 2018-11-12 DIAGNOSIS — F329 Major depressive disorder, single episode, unspecified: Secondary | ICD-10-CM | POA: Diagnosis not present

## 2018-11-12 DIAGNOSIS — R739 Hyperglycemia, unspecified: Secondary | ICD-10-CM | POA: Diagnosis not present

## 2018-11-12 DIAGNOSIS — Z8639 Personal history of other endocrine, nutritional and metabolic disease: Secondary | ICD-10-CM | POA: Diagnosis not present

## 2018-11-12 DIAGNOSIS — Z0000 Encounter for general adult medical examination without abnormal findings: Secondary | ICD-10-CM | POA: Diagnosis not present

## 2018-11-12 DIAGNOSIS — L4050 Arthropathic psoriasis, unspecified: Secondary | ICD-10-CM | POA: Diagnosis not present

## 2018-11-12 DIAGNOSIS — Z23 Encounter for immunization: Secondary | ICD-10-CM | POA: Diagnosis not present

## 2018-11-12 DIAGNOSIS — E785 Hyperlipidemia, unspecified: Secondary | ICD-10-CM | POA: Diagnosis not present

## 2018-11-12 DIAGNOSIS — M549 Dorsalgia, unspecified: Secondary | ICD-10-CM | POA: Diagnosis not present

## 2018-11-12 DIAGNOSIS — Z79899 Other long term (current) drug therapy: Secondary | ICD-10-CM | POA: Diagnosis not present

## 2018-12-22 ENCOUNTER — Other Ambulatory Visit: Payer: Self-pay | Admitting: Internal Medicine

## 2018-12-22 DIAGNOSIS — N63 Unspecified lump in unspecified breast: Secondary | ICD-10-CM

## 2019-01-01 DIAGNOSIS — M150 Primary generalized (osteo)arthritis: Secondary | ICD-10-CM | POA: Diagnosis not present

## 2019-01-01 DIAGNOSIS — M79645 Pain in left finger(s): Secondary | ICD-10-CM | POA: Diagnosis not present

## 2019-01-01 DIAGNOSIS — L4050 Arthropathic psoriasis, unspecified: Secondary | ICD-10-CM | POA: Diagnosis not present

## 2019-01-01 DIAGNOSIS — M189 Osteoarthritis of first carpometacarpal joint, unspecified: Secondary | ICD-10-CM | POA: Diagnosis not present

## 2019-01-01 DIAGNOSIS — M5136 Other intervertebral disc degeneration, lumbar region: Secondary | ICD-10-CM | POA: Diagnosis not present

## 2019-01-01 DIAGNOSIS — M79644 Pain in right finger(s): Secondary | ICD-10-CM | POA: Diagnosis not present

## 2019-01-01 DIAGNOSIS — M459 Ankylosing spondylitis of unspecified sites in spine: Secondary | ICD-10-CM | POA: Diagnosis not present

## 2019-02-09 DIAGNOSIS — E785 Hyperlipidemia, unspecified: Secondary | ICD-10-CM | POA: Diagnosis not present

## 2019-02-09 DIAGNOSIS — R739 Hyperglycemia, unspecified: Secondary | ICD-10-CM | POA: Diagnosis not present

## 2019-02-09 DIAGNOSIS — F329 Major depressive disorder, single episode, unspecified: Secondary | ICD-10-CM | POA: Diagnosis not present

## 2019-02-09 DIAGNOSIS — Z79899 Other long term (current) drug therapy: Secondary | ICD-10-CM | POA: Diagnosis not present

## 2019-03-03 DIAGNOSIS — R05 Cough: Secondary | ICD-10-CM | POA: Diagnosis not present

## 2019-03-09 ENCOUNTER — Ambulatory Visit: Payer: Medicare HMO | Attending: Internal Medicine

## 2019-03-09 DIAGNOSIS — Z20822 Contact with and (suspected) exposure to covid-19: Secondary | ICD-10-CM | POA: Diagnosis not present

## 2019-03-10 ENCOUNTER — Encounter: Payer: Self-pay | Admitting: *Deleted

## 2019-03-10 LAB — NOVEL CORONAVIRUS, NAA: SARS-CoV-2, NAA: DETECTED — AB

## 2019-03-11 ENCOUNTER — Telehealth: Payer: Self-pay | Admitting: Internal Medicine

## 2019-03-11 NOTE — Telephone Encounter (Signed)
Called to discuss with [place] [facility] about Covid symptoms and the use of bamlanivimab, a monoclonal antibody infusion for those with mild to moderate Covid symptoms and at a high risk of hospitalization.    Pt does not qualify for infusion therapy as her symptoms first presented > 10 days prior to timing of infusion. Pt states symptoms ongoing for 4 weeks. Symptoms tier reviewed as well as criteria for ending isolation. Preventative practices reviewed. Patient verbalized understanding    Patient Active Problem List   Diagnosis Date Noted  . Chest pain 10/30/2014  . Numbness and tingling in hands 10/30/2014  . Nausea and vomiting 10/30/2014  . Loss of weight 10/30/2014  . Hypokalemia 10/29/2014   Alan Ripper, NP-C [place]  pgr [phone]

## 2019-05-26 DIAGNOSIS — M79645 Pain in left finger(s): Secondary | ICD-10-CM | POA: Diagnosis not present

## 2019-05-26 DIAGNOSIS — L4050 Arthropathic psoriasis, unspecified: Secondary | ICD-10-CM | POA: Diagnosis not present

## 2019-05-26 DIAGNOSIS — M189 Osteoarthritis of first carpometacarpal joint, unspecified: Secondary | ICD-10-CM | POA: Diagnosis not present

## 2019-05-26 DIAGNOSIS — M150 Primary generalized (osteo)arthritis: Secondary | ICD-10-CM | POA: Diagnosis not present

## 2019-05-26 DIAGNOSIS — M5136 Other intervertebral disc degeneration, lumbar region: Secondary | ICD-10-CM | POA: Diagnosis not present

## 2019-07-14 DIAGNOSIS — Z1331 Encounter for screening for depression: Secondary | ICD-10-CM | POA: Diagnosis not present

## 2019-07-14 DIAGNOSIS — Z1339 Encounter for screening examination for other mental health and behavioral disorders: Secondary | ICD-10-CM | POA: Diagnosis not present

## 2019-07-14 DIAGNOSIS — Z79899 Other long term (current) drug therapy: Secondary | ICD-10-CM | POA: Diagnosis not present

## 2019-07-14 DIAGNOSIS — Z79891 Long term (current) use of opiate analgesic: Secondary | ICD-10-CM | POA: Diagnosis not present

## 2019-08-13 DIAGNOSIS — Z1331 Encounter for screening for depression: Secondary | ICD-10-CM | POA: Diagnosis not present

## 2019-08-13 DIAGNOSIS — Z79899 Other long term (current) drug therapy: Secondary | ICD-10-CM | POA: Diagnosis not present

## 2019-08-13 DIAGNOSIS — Z1339 Encounter for screening examination for other mental health and behavioral disorders: Secondary | ICD-10-CM | POA: Diagnosis not present

## 2019-08-13 DIAGNOSIS — M545 Low back pain: Secondary | ICD-10-CM | POA: Diagnosis not present

## 2019-09-10 DIAGNOSIS — M545 Low back pain: Secondary | ICD-10-CM | POA: Diagnosis not present

## 2019-09-10 DIAGNOSIS — Z79891 Long term (current) use of opiate analgesic: Secondary | ICD-10-CM | POA: Diagnosis not present

## 2019-09-10 DIAGNOSIS — Z79899 Other long term (current) drug therapy: Secondary | ICD-10-CM | POA: Diagnosis not present

## 2019-10-05 DIAGNOSIS — L4050 Arthropathic psoriasis, unspecified: Secondary | ICD-10-CM | POA: Diagnosis not present

## 2019-10-05 DIAGNOSIS — L409 Psoriasis, unspecified: Secondary | ICD-10-CM | POA: Diagnosis not present

## 2019-10-05 DIAGNOSIS — M549 Dorsalgia, unspecified: Secondary | ICD-10-CM | POA: Diagnosis not present

## 2019-10-05 DIAGNOSIS — Z79899 Other long term (current) drug therapy: Secondary | ICD-10-CM | POA: Diagnosis not present

## 2019-10-05 DIAGNOSIS — M79645 Pain in left finger(s): Secondary | ICD-10-CM | POA: Diagnosis not present

## 2019-10-05 DIAGNOSIS — M79644 Pain in right finger(s): Secondary | ICD-10-CM | POA: Diagnosis not present

## 2019-10-09 DIAGNOSIS — M545 Low back pain: Secondary | ICD-10-CM | POA: Diagnosis not present

## 2019-10-09 DIAGNOSIS — L989 Disorder of the skin and subcutaneous tissue, unspecified: Secondary | ICD-10-CM | POA: Diagnosis not present

## 2019-10-09 DIAGNOSIS — F411 Generalized anxiety disorder: Secondary | ICD-10-CM | POA: Diagnosis not present

## 2019-10-09 DIAGNOSIS — Z79899 Other long term (current) drug therapy: Secondary | ICD-10-CM | POA: Diagnosis not present

## 2020-05-11 ENCOUNTER — Other Ambulatory Visit: Payer: Self-pay | Admitting: Nurse Practitioner

## 2020-05-11 DIAGNOSIS — Z1231 Encounter for screening mammogram for malignant neoplasm of breast: Secondary | ICD-10-CM

## 2020-05-17 ENCOUNTER — Other Ambulatory Visit: Payer: Self-pay | Admitting: Nurse Practitioner

## 2020-05-17 DIAGNOSIS — R928 Other abnormal and inconclusive findings on diagnostic imaging of breast: Secondary | ICD-10-CM

## 2020-06-21 ENCOUNTER — Ambulatory Visit
Admission: RE | Admit: 2020-06-21 | Discharge: 2020-06-21 | Disposition: A | Discharge status: Home / Self Care | Payer: Medicare HMO | Source: Ambulatory Visit | Attending: Nurse Practitioner | Admitting: Nurse Practitioner

## 2020-06-21 ENCOUNTER — Other Ambulatory Visit: Payer: Self-pay

## 2020-06-21 ENCOUNTER — Ambulatory Visit: Payer: Medicare HMO

## 2020-06-21 DIAGNOSIS — R928 Other abnormal and inconclusive findings on diagnostic imaging of breast: Secondary | ICD-10-CM

## 2021-02-15 ENCOUNTER — Ambulatory Visit: Payer: Medicare HMO | Admitting: Audiologist

## 2021-02-22 ENCOUNTER — Ambulatory Visit: Payer: Medicare HMO | Attending: Nurse Practitioner | Admitting: Audiologist

## 2021-02-22 ENCOUNTER — Other Ambulatory Visit: Payer: Self-pay

## 2021-02-22 DIAGNOSIS — R42 Dizziness and giddiness: Secondary | ICD-10-CM | POA: Diagnosis present

## 2021-02-22 DIAGNOSIS — H903 Sensorineural hearing loss, bilateral: Secondary | ICD-10-CM | POA: Diagnosis not present

## 2021-02-22 NOTE — Procedures (Signed)
°  [facility] [place], [place]  [address] [phone]  AUDIOLOGICAL  EVALUATION  NAME: Chloe Odom     DOB:   10/21/1950      MRN: [id number]                                                                                     DATE: 02/22/2021     REFERENT: Jani Gravel, MD STATUS: Outpatient DIAGNOSIS: Sensorineural Hearing Loss, Asymmetric. Dizziness    History: [name] was seen for an audiological evaluation.  [name] is receiving a hearing evaluation due to concerns for difficulty hearing her family. [name]'s family tells her she asks them to repeat a lot. They feel she does not hear them well. This difficulty began gradually. No pain or pressure reported in either ear. Tinnitus present in both ears intermittently. In the right ear she occasionally hears a rushing air sound that follows her hear beat. [name] is also having spells of dizziness lasting more than a minute. [name] has a history of noise exposure from working at a [employer].  Medical history negative for a condition which is a risk factor for hearing loss. No other relevant case history reported.   Evaluation:  Otoscopy showed a clear view of the tympanic membranes, bilaterally Tympanometry results were consistent with normal middle ear pressure bilaterally   Audiometric testing was completed using conventional audiometry with insert ans supraural transducer. Speech Recognition Thresholds were consistent with pure tone averages. Word Recognition was excellent at an elevated level. Pure tone thresholds show normal sloping to moderate high frequency sensorineural hearing loss in both ears. Test results are consistent with asymmetry at 500Hz  with the right ear worse.   Results:  The test results were reviewed with [name]. Due to her asymmetric hearing loss, dizziness and occasional tinnitus matching her heart beat a medical evaluation with Otolaryngology is recommended. She reported  understanding.    Recommendations: Amplification is necessary for both ears. Hearing aids can be purchased from a variety of locations. See provided list for locations in the [place] area.  Referral to ENT Physician necessary due to asymmetric hearing, dizziness, pulsing tinnitus. Medical evaluation necessary before hearing aid trial.    Laurence Ferrari, Au.D., CCC-A 02/22/2021  4:55 PM  Cc: Emelia Loron NP

## 2021-09-01 ENCOUNTER — Encounter: Payer: Self-pay | Admitting: Internal Medicine

## 2021-09-21 IMAGING — MG DIGITAL DIAGNOSTIC BILAT W/ TOMO W/ CAD
8 series · 8 of 24 positions shown · non-contrast
Comparison: Previous exam(s).

CLINICAL DATA: Five year follow-up of a left breast mass

EXAM:
DIGITAL DIAGNOSTIC BILATERAL MAMMOGRAM WITH TOMOSYNTHESIS AND CAD
TECHNIQUE: Bilateral digital diagnostic mammography and breast tomosynthesis
was performed. The images were evaluated with computer-aided
detection.

[L MLO synth-2D]
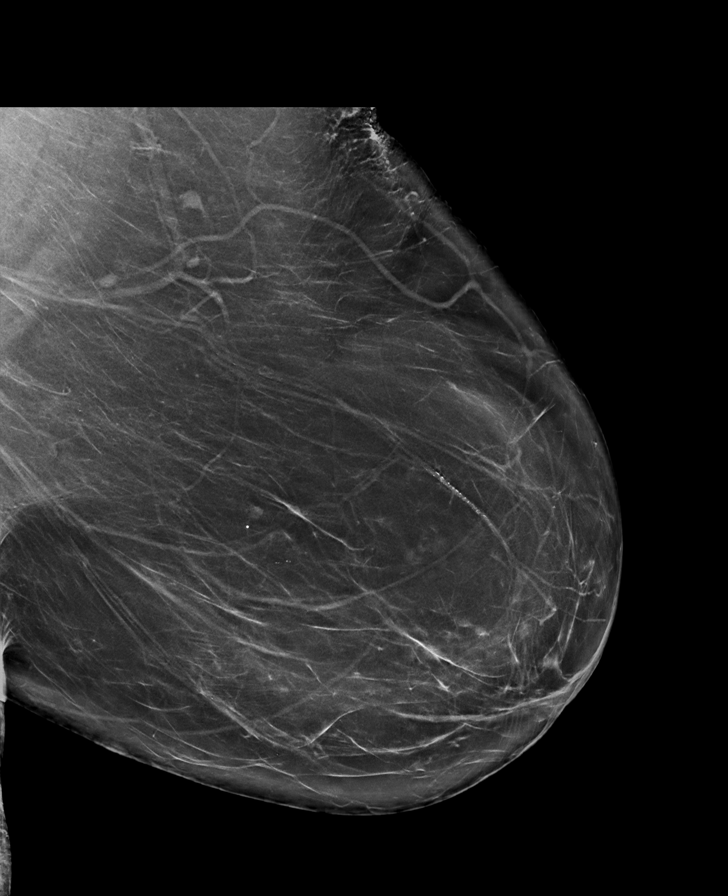

[R CC synth-2D]
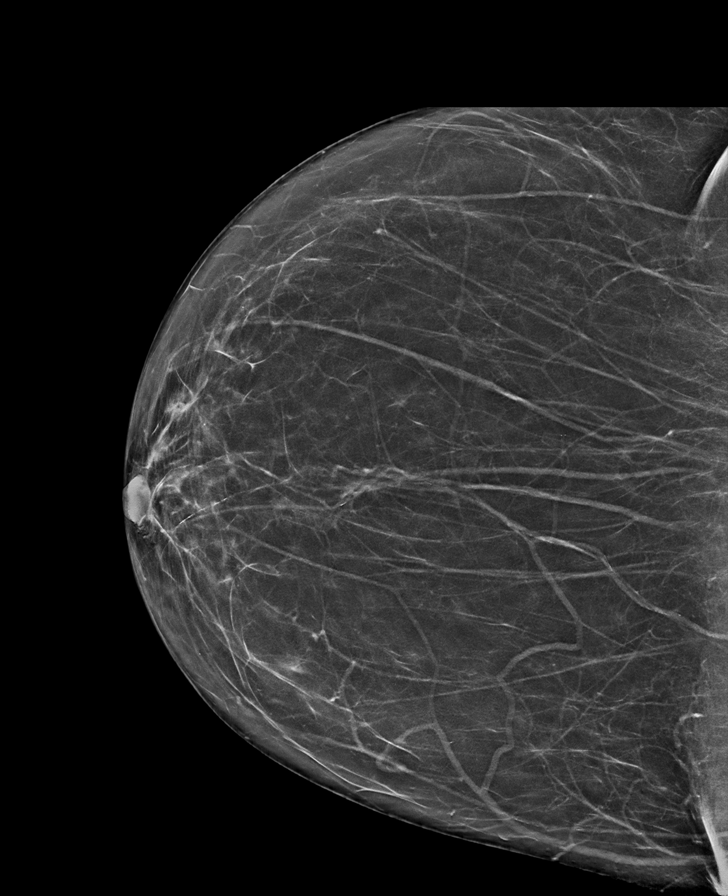

[R MLO synth-2D]
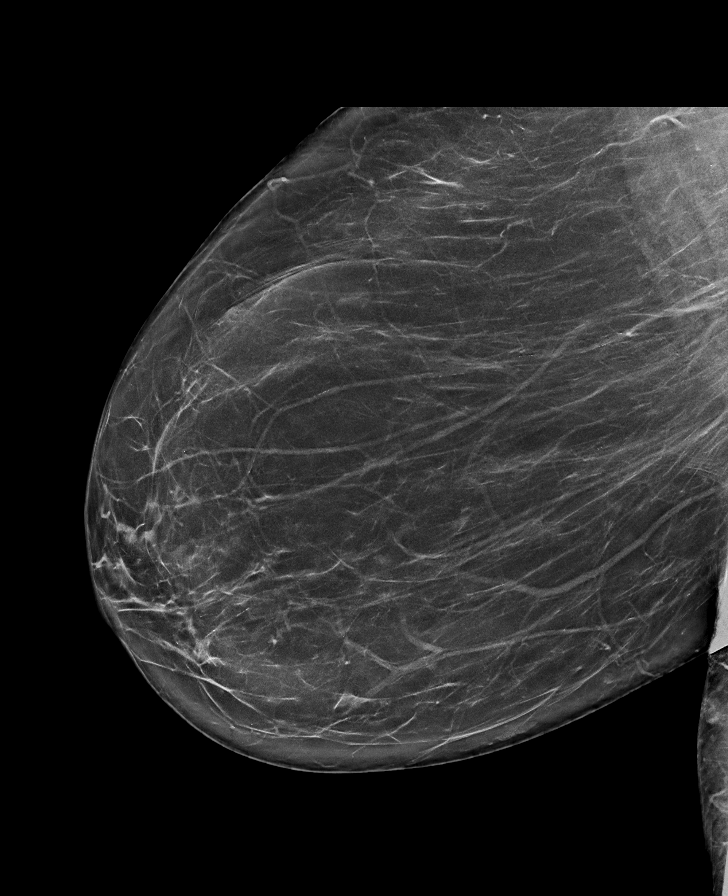

[L CC synth-2D]
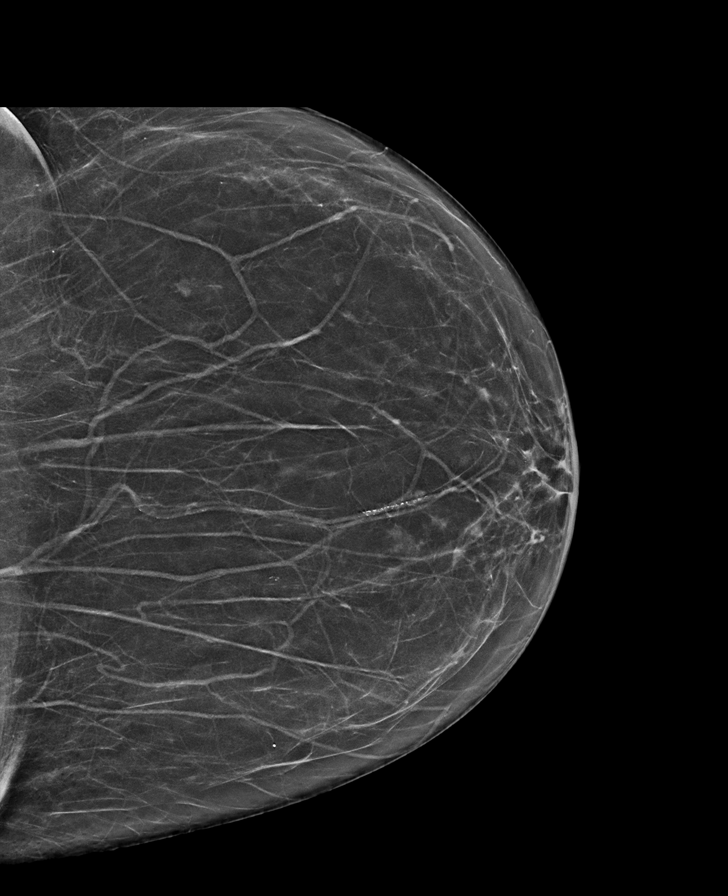

[R MLO tomo · tomo slice 41/81.0]
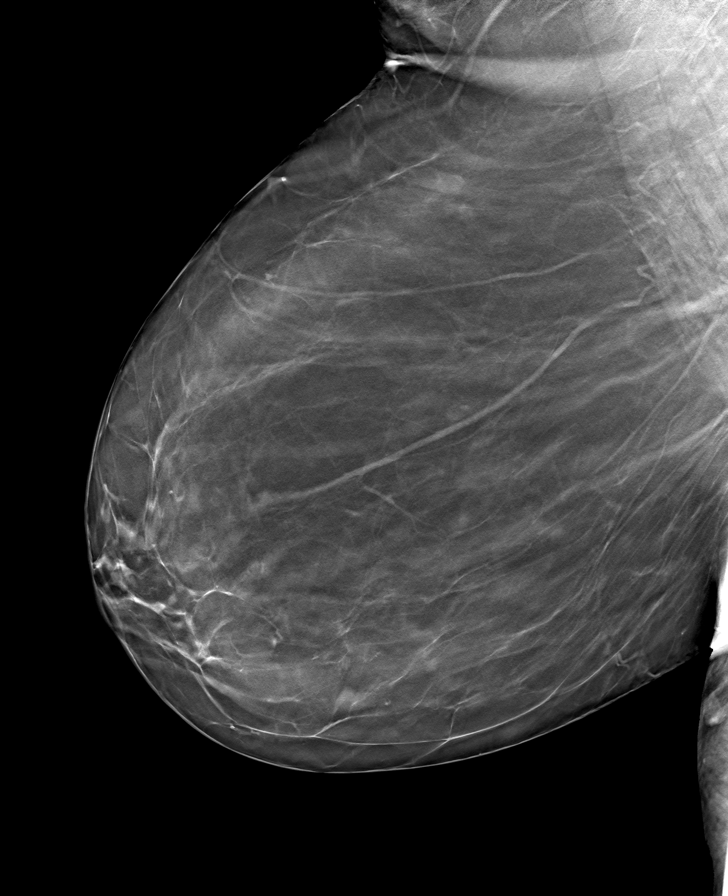

[L CC tomo · tomo slice 35/69.0]
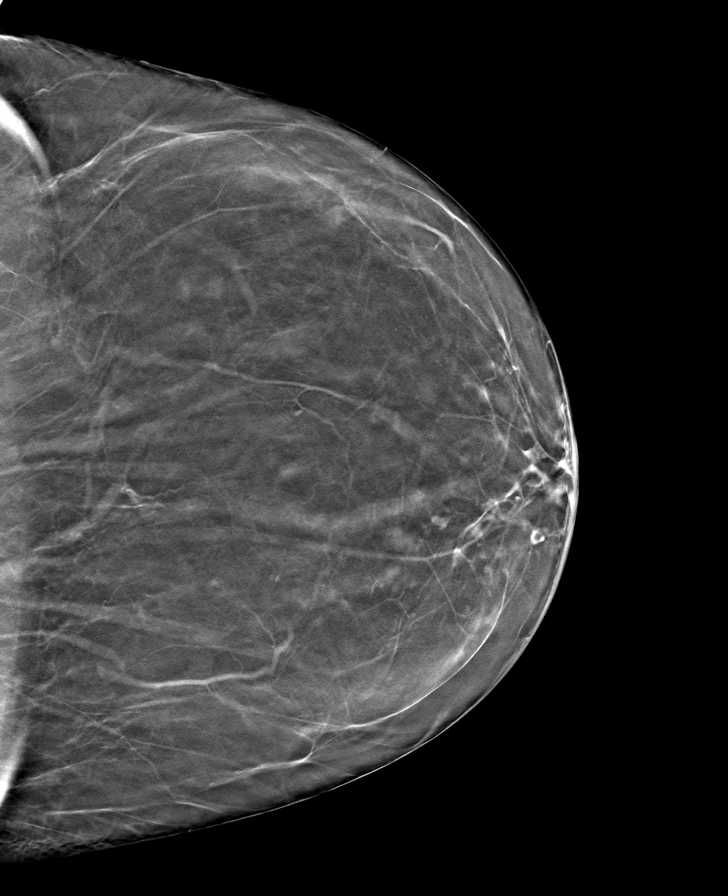

[R CC tomo · tomo slice 37/72.0]
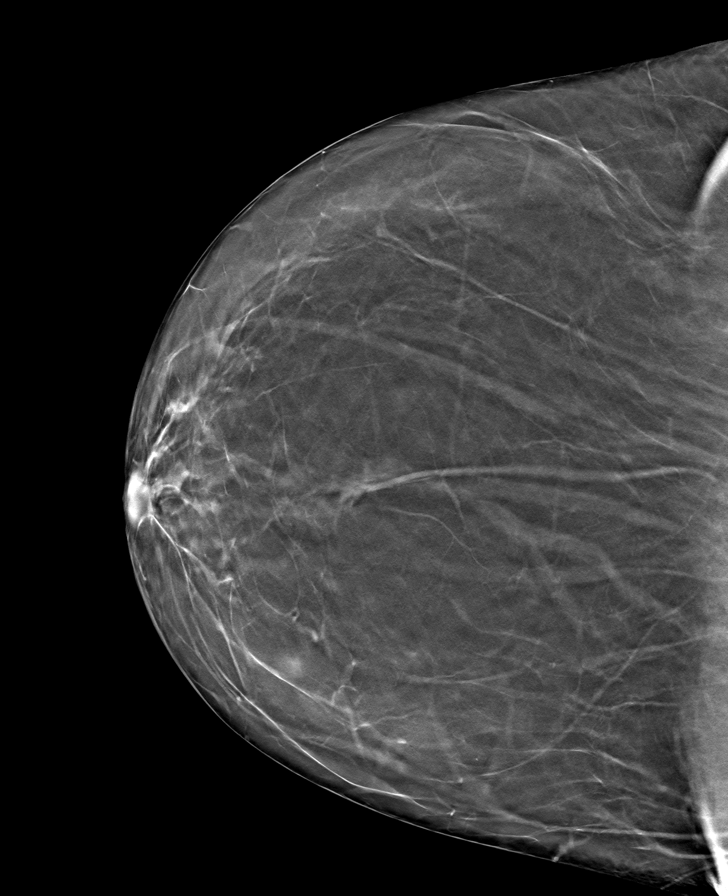

[L MLO tomo · tomo slice 44/87.0]
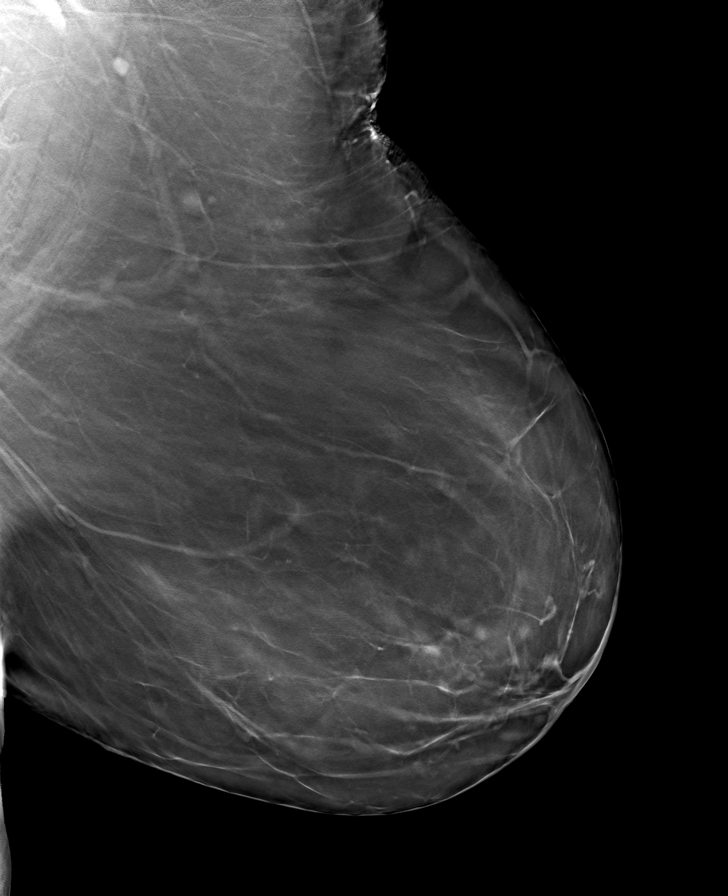

[8 of 24 positions shown; findings below may reference images not displayed]

ACR Breast Density Category b: There are scattered areas of
fibroglandular density.
FINDINGS: The mass in the lateral left breast is smaller when compared to
1454. No other suspicious findings in either breast.
IMPRESSION: No mammographic evidence of malignancy. Benign left breast mass,
smaller since 1454.

RECOMMENDATION:
Annual screening mammography.

I have discussed the findings and recommendations with the patient.
If applicable, a reminder letter will be sent to the patient
regarding the next appointment.

BI-RADS CATEGORY  2: Benign.

## 2023-11-11 ENCOUNTER — Encounter: Payer: Self-pay | Admitting: Neurology

## 2023-12-27 NOTE — Progress Notes (Signed)
 Assessment/Plan:  1.  Parkinsonism  -I had a long counseling session with the patient today.  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  I explained that one clinically cannot tell the difference between idiopathic parkinsons disease and secondary parkinsonism from medication.  I also explained that even if one is able to get off of the medication, it can take up to 6 months to clinically definitively know if this is idiopathic parkinsons disease.  I did not advise that the patient go off of medication, as this needs to be discussed with the patients prescribing physician.  I did, however, tell the patient that the longer one is on the medication, the worse the symptoms can get.  The patient is to make an appointment with prescribing PA to discuss what I have discussed with her.   2.  MDD  - On Vraylar  - On escitalopram   - On quetiapine, 100 mg  - It is somewhat unusual to be on 2 different antipsychotics.  Would certainly defer to prescribing provider, but would certainly watch QT interval closely.  I did discuss this with patient and daughter.  Subjective:   Chloe Odom was seen today in the movement disorders clinic for neurologic consultation at the request of Claudene Jackee ONEIDA DEVONNA.  The consultation is for the evaluation of tremor.  Pt with daughter who supplements hx.  Tremor: Yes.     How long has it been going on? 6+ months  At rest or with activation?  rest  When is it noted the most?  When stressed  Fam hx of tremor?  Brothers with tremor, but unknown dx  Located where?  R hand  Affected by caffeine:  No.  Affected by alcohol :  doesn't drink alcohol   Affected by stress:  Yes.    Affected by fatigue:  Yes.    Affects ADL's (tying shoes, brushing teeth, etc):  No.  Tremor inducing meds:  Yes.    Vraylar, quetiapine  Exposure to antipsychotics: Yes, currently on Vraylar (been on this since at least 2023 per records from [facility])  and also on 100 mg quetiapine;   Other Specific Symptoms:  Voice: usually its good but my tongue does funny and it clicks Postural symptoms:  occasionally I get off balance  Falls?  No. Bradykinesia symptoms: shuffling gait, slow movements, and difficulty getting out of a chair Loss of smell:  No. Loss of taste:  No. Urinary Incontinence:  has some stress and urge incontinence and wears pad Depression:  controlled on medication Hallucinations:  No.  visual distortions: Yes.  , occasionally N/V:  No. Lightheaded:  No.  Syncope: No. Diplopia:  No.   Neuroimaging of the brain has not previously been performed.   ALLERGIES:   Allergies  Allergen Reactions   Benadryl [Diphenhydramine] Other (See Comments)    Patient states makes my legs drawing up   Penicillins     Childhood allergy   Prednisone     jitters   Wellbutrin [Bupropion]     Patient states makes her crazy    CURRENT MEDICATIONS:  Current Outpatient Medications  Medication Instructions   ALPRAZolam  (XANAX ) 1 mg, 3 times daily PRN   escitalopram  (LEXAPRO ) 10 mg, Daily   folic acid  (FOLVITE ) 1 MG tablet TK 2 TS PO QD   ibuprofen (ADVIL) 200 mg, Every 6 hours PRN   meclizine  (ANTIVERT ) 25 mg, 3 times daily PRN   methotrexate (RHEUMATREX) 2.5 MG tablet  No dose, route, or frequency recorded.   Multiple Vitamin (MULTIVITAMIN WITH MINERALS) TABS tablet 1 tablet, Daily   ondansetron  (ZOFRAN ) 4 mg, Oral, Every 6 hours PRN   OXYCODONE  HCL PO 5 mg, 5 times daily PRN   pantoprazole  (PROTONIX ) 40 mg, Oral, Daily   PEG-KCl-NaCl-NaSulf-Na Asc-C (PLENVU) 140 g SOLR Take by mouth.   QUEtiapine (SEROQUEL) 100 mg, Daily at bedtime   Vraylar 1.5 mg, Daily    Objective:   PHYSICAL EXAMINATION:    VITALS:   Vitals:   12/31/23 1314  BP: (!) 142/76  Pulse: 74  SpO2: 93%  Weight: 220 lb (99.8 kg)  Height: 5' 5 (1.651 m)    GEN:  The patient appears stated age and is in NAD. HEENT:  Normocephalic, atraumatic.  The  mucous membranes are moist. The superficial temporal arteries are without ropiness or tenderness. CV:  RRR Lungs:  CTAB Neck/HEME:  There are no carotid bruits bilaterally.  Neurological examination:  Orientation: The patient is alert and oriented x3.  Cranial nerves: There is good facial symmetry.  There is facial hypomimia.  Extraocular muscles are intact. The visual fields are full to confrontational testing. The speech is fluent and clear. Soft palate rises symmetrically and there is no tongue deviation. Hearing is intact to conversational tone. Sensation: Sensation is intact to light touch throughout (facial, trunk, extremities). Vibration is intact at the bilateral big toe. There is no extinction with double simultaneous stimulation.  Motor: Strength is 5/5 in the bilateral upper and lower extremities.   Shoulder shrug is equal and symmetric.  There is no pronator drift. Deep tendon reflexes: Deep tendon reflexes are 2+-3/4 at the bilateral biceps, triceps, brachioradialis, patella and achilles. Plantar responses are downgoing bilaterally.  Movement examination: Tone: There is nl tone in the bilateral upper extremities.  The tone in the lower extremities is nl.  Abnormal movements: there is RUE rest tremor that increases with distraction Coordination:  There is no decremation with RAM's, with any form of RAMS, including alternating supination and pronation of the forearm, hand opening and closing, finger taps, heel taps and toe taps.  Gait and Station: The patient pushes off to arise.  The patient's stride length is decreased.   I have reviewed and interpreted the following labs independently   Chemistry      Component Value Date/Time   NA 140 10/31/2014 0540   K 3.5 10/31/2014 0540   CL 108 10/31/2014 0540   CO2 24 10/31/2014 0540   BUN <5 (L) 10/31/2014 0540   CREATININE 0.78 10/31/2014 0540      Component Value Date/Time   CALCIUM  8.6 (L) 10/31/2014 0540   ALKPHOS 97  10/29/2014 1802   AST 41 10/29/2014 1802   ALT 39 10/29/2014 1802   BILITOT 1.2 10/29/2014 1802      Lab Results  Component Value Date   TSH 5.142 (H) 10/30/2014   Lab Results  Component Value Date   WBC 7.2 10/30/2014   HGB 14.1 10/30/2014   HCT 40.5 10/30/2014   MCV 86.2 10/30/2014   PLT 186 10/30/2014      Total time spent on today's visit was 45 minutes, including both face-to-face time and nonface-to-face time.  Time included that spent on review of records (prior notes available to me/labs/imaging if pertinent), discussing treatment and goals, answering patient's questions and coordinating care.  Cc:  Jerel Gee, NP

## 2023-12-31 ENCOUNTER — Ambulatory Visit: Admitting: Neurology

## 2023-12-31 ENCOUNTER — Encounter: Payer: Self-pay | Admitting: Neurology

## 2023-12-31 VITALS — BP 142/76 | HR 74 | Ht 65.0 in | Wt 220.0 lb

## 2023-12-31 DIAGNOSIS — T43505A Adverse effect of unspecified antipsychotics and neuroleptics, initial encounter: Secondary | ICD-10-CM

## 2023-12-31 DIAGNOSIS — G2111 Neuroleptic induced parkinsonism: Secondary | ICD-10-CM

## 2023-12-31 DIAGNOSIS — F339 Major depressive disorder, recurrent, unspecified: Secondary | ICD-10-CM | POA: Diagnosis not present

## 2023-12-31 NOTE — Patient Instructions (Signed)
 We have discussed that your parkinsonian symptoms are likely due to your vraylar medication.  You need to contact your prescribing physician or provider to discuss what we discussed today.  Do not just stop the medication on your own.  If you are able to get off of the medication,  it can take up to 6 months to resolve some of the symptoms that you are experiencing.  If you are able to get off of the medication AND your symptoms are not resolved in 6 months, then I want to see you back in the office for a follow up appointment.  We also discussed that I generally don't recommend 2 different antipsychotics (vrayar and seroquel) be used in the same patient.  You can discuss this with your prescribing provider.  The physicians and staff at [facility] are committed to providing excellent care. You may receive a survey requesting feedback about your experience at our office. We strive to receive very good responses to the survey questions. If you feel that your experience would prevent you from giving the office a very good  response, please contact our office to try to remedy the situation. We may be reached at [phone]. Thank you for taking the time out of your busy day to complete the survey.

## 2024-02-16 ENCOUNTER — Other Ambulatory Visit (HOSPITAL_BASED_OUTPATIENT_CLINIC_OR_DEPARTMENT_OTHER): Payer: Self-pay | Admitting: Internal Medicine

## 2024-02-16 DIAGNOSIS — Z780 Asymptomatic menopausal state: Secondary | ICD-10-CM

## 2024-03-13 ENCOUNTER — Ambulatory Visit: Admitting: Cardiology

## 2024-03-30 ENCOUNTER — Ambulatory Visit: Admitting: Cardiology
